# Patient Record
Sex: Male | Born: 1935 | Race: White | Hispanic: No | Smoking: Former smoker
Health system: Southern US, Community
[De-identification: ages and names within clinical notes are randomized; demographics above are authoritative.]

## PROBLEM LIST (undated history)

## (undated) DIAGNOSIS — E785 Hyperlipidemia, unspecified: Secondary | ICD-10-CM

## (undated) DIAGNOSIS — I10 Essential (primary) hypertension: Secondary | ICD-10-CM

## (undated) DIAGNOSIS — G629 Polyneuropathy, unspecified: Secondary | ICD-10-CM

## (undated) DIAGNOSIS — J60 Coalworker's pneumoconiosis: Secondary | ICD-10-CM

## (undated) HISTORY — DX: Hyperlipidemia, unspecified: E78.5

## (undated) HISTORY — DX: Essential (primary) hypertension: I10

## (undated) HISTORY — DX: Coalworker's pneumoconiosis: J60

## (undated) HISTORY — DX: Polyneuropathy, unspecified: G62.9

---

## 2009-03-28 ENCOUNTER — Telehealth: Payer: Self-pay | Admitting: Internal Medicine

## 2009-03-29 ENCOUNTER — Ambulatory Visit: Payer: Self-pay | Admitting: Hematology and Oncology

## 2009-03-29 ENCOUNTER — Encounter: Payer: Self-pay | Admitting: Internal Medicine

## 2009-04-10 LAB — MORPHOLOGY

## 2009-04-10 LAB — CBC & DIFF AND RETIC
EOS%: 4 % (ref 0.0–7.0)
MCH: 28.3 pg (ref 27.2–33.4)
MCV: 88.4 fL (ref 79.3–98.0)
MONO%: 15.9 % — ABNORMAL HIGH (ref 0.0–14.0)
NEUT#: 2.8 10*3/uL (ref 1.5–6.5)
RBC: 4.67 10*6/uL (ref 4.20–5.82)
RDW: 16.1 % — ABNORMAL HIGH (ref 11.0–14.6)
Retic %: 1.42 % (ref 0.50–1.60)
Retic Ct Abs: 66.31 10*3/uL (ref 24.10–77.50)

## 2009-04-12 LAB — FERRITIN: Ferritin: 26 ng/mL (ref 22–322)

## 2009-04-12 LAB — PROTEIN ELECTROPHORESIS, SERUM, WITH REFLEX
Alpha-2-Globulin: 10 % (ref 7.1–11.8)
Beta 2: 3 % — ABNORMAL LOW (ref 3.2–6.5)
Beta Globulin: 6.1 % (ref 4.7–7.2)
Total Protein, Serum Electrophoresis: 8.2 g/dL (ref 6.0–8.3)

## 2009-04-12 LAB — COMPREHENSIVE METABOLIC PANEL
ALT: 17 U/L (ref 0–53)
Albumin: 4.8 g/dL (ref 3.5–5.2)
BUN: 22 mg/dL (ref 6–23)
Creatinine, Ser: 1.36 mg/dL (ref 0.40–1.50)
Total Bilirubin: 0.7 mg/dL (ref 0.3–1.2)
Total Protein: 8.2 g/dL (ref 6.0–8.3)

## 2009-04-12 LAB — LACTATE DEHYDROGENASE: LDH: 211 U/L (ref 94–250)

## 2009-10-15 ENCOUNTER — Ambulatory Visit: Payer: Self-pay | Admitting: Hematology and Oncology

## 2010-03-11 NOTE — Letter (Signed)
Summary: New Patient letter  [facility]  [address] [place], [place] [address]   Phone: [phone]  Fax: [phone]       03/29/2009 MRN: [id number]  Ernest Sanchez [address] [place], [place]  [address]  Dear Mr. Martos,  Welcome to the Gastroenterology Division at [facility].    You are scheduled to see Dr. Darylene Price on Monday- March 7,2011 at 10:15 a.m. on the 3rd floor at [facility], [address].  We ask that you try to arrive at our office 15 minutes prior to your appointment time to allow for check-in.  We would like you to complete the enclosed self-administered evaluation form prior to your visit and bring it with you on the day of your appointment.  We will review it with you.  Also, please bring a complete list of all your medications or, if you prefer, bring the medication bottles and we will list them.  Please bring your insurance card so that we may make a copy of it.  If your insurance requires a referral to see a specialist, please bring your referral form from your primary care physician.  Co-payments are due at the time of your visit and may be paid by cash, check or credit card.     Your office visit will consist of a consult with your physician (includes a physical exam), any laboratory testing he/she may order, scheduling of any necessary diagnostic testing (e.g. x-ray, ultrasound, CT-scan), and scheduling of a procedure (e.g. Endoscopy, Colonoscopy) if required.  Please allow enough time on your schedule to allow for any/all of these possibilities.    If you cannot keep your appointment, please call [phone] to cancel or reschedule prior to your appointment date.  This allows [place] the opportunity to schedule an appointment for another patient in need of care.  If you do not cancel or reschedule by 5 p.m. the business day prior to your appointment date, you will be charged a $50.00 late cancellation/no-show fee.    Thank you for  choosing [facility] for your medical needs.  We appreciate the opportunity to care for you.  Please visit [place] at our website  to learn more about our practice.                     Sincerely,                                                             The Gastroenterology Division

## 2010-03-11 NOTE — Progress Notes (Signed)
Summary: Appt sooner than next available  Phone Note From Other Clinic   Caller: [facility] @ Dr Silvano Rusk office [phone] Call For: Dr Marina Goodell Reason for Call: Schedule Patient Appt Summary of Call: Wonders if you can work in patient sooner than March 30th. Will fax you the few notes she has-pt is new to them. Initial call taken by: Leanor Kail [facility],  March 28, 2009 4:29 PM  Follow-up for Phone Call        Pt. ntfd. that appt.has been moved up to 04/15/2009. Follow-up by: Teryl Lucy RN,  March 29, 2009 12:14 PM

## 2011-02-11 DIAGNOSIS — Z7901 Long term (current) use of anticoagulants: Secondary | ICD-10-CM | POA: Diagnosis not present

## 2011-02-11 DIAGNOSIS — I4891 Unspecified atrial fibrillation: Secondary | ICD-10-CM | POA: Diagnosis not present

## 2011-03-12 DIAGNOSIS — Z7901 Long term (current) use of anticoagulants: Secondary | ICD-10-CM | POA: Diagnosis not present

## 2011-03-12 DIAGNOSIS — I4891 Unspecified atrial fibrillation: Secondary | ICD-10-CM | POA: Diagnosis not present

## 2011-04-08 DIAGNOSIS — I4891 Unspecified atrial fibrillation: Secondary | ICD-10-CM | POA: Diagnosis not present

## 2011-04-08 DIAGNOSIS — Z7901 Long term (current) use of anticoagulants: Secondary | ICD-10-CM | POA: Diagnosis not present

## 2011-05-06 DIAGNOSIS — C4441 Basal cell carcinoma of skin of scalp and neck: Secondary | ICD-10-CM | POA: Diagnosis not present

## 2011-05-06 DIAGNOSIS — I4891 Unspecified atrial fibrillation: Secondary | ICD-10-CM | POA: Diagnosis not present

## 2011-05-06 DIAGNOSIS — Z85828 Personal history of other malignant neoplasm of skin: Secondary | ICD-10-CM | POA: Diagnosis not present

## 2011-05-06 DIAGNOSIS — C44319 Basal cell carcinoma of skin of other parts of face: Secondary | ICD-10-CM | POA: Diagnosis not present

## 2011-05-06 DIAGNOSIS — L570 Actinic keratosis: Secondary | ICD-10-CM | POA: Diagnosis not present

## 2011-05-06 DIAGNOSIS — Z7901 Long term (current) use of anticoagulants: Secondary | ICD-10-CM | POA: Diagnosis not present

## 2011-05-06 DIAGNOSIS — D239 Other benign neoplasm of skin, unspecified: Secondary | ICD-10-CM | POA: Diagnosis not present

## 2011-06-01 DIAGNOSIS — I4891 Unspecified atrial fibrillation: Secondary | ICD-10-CM | POA: Diagnosis not present

## 2011-06-01 DIAGNOSIS — K219 Gastro-esophageal reflux disease without esophagitis: Secondary | ICD-10-CM | POA: Diagnosis not present

## 2011-06-01 DIAGNOSIS — R509 Fever, unspecified: Secondary | ICD-10-CM | POA: Diagnosis not present

## 2011-06-01 DIAGNOSIS — J209 Acute bronchitis, unspecified: Secondary | ICD-10-CM | POA: Diagnosis not present

## 2011-06-09 DIAGNOSIS — I4891 Unspecified atrial fibrillation: Secondary | ICD-10-CM | POA: Diagnosis not present

## 2011-06-09 DIAGNOSIS — R05 Cough: Secondary | ICD-10-CM | POA: Diagnosis not present

## 2011-06-09 DIAGNOSIS — Z7901 Long term (current) use of anticoagulants: Secondary | ICD-10-CM | POA: Diagnosis not present

## 2011-06-29 DIAGNOSIS — C44211 Basal cell carcinoma of skin of unspecified ear and external auricular canal: Secondary | ICD-10-CM | POA: Diagnosis not present

## 2011-07-09 DIAGNOSIS — Z7901 Long term (current) use of anticoagulants: Secondary | ICD-10-CM | POA: Diagnosis not present

## 2011-07-09 DIAGNOSIS — I4891 Unspecified atrial fibrillation: Secondary | ICD-10-CM | POA: Diagnosis not present

## 2011-08-11 DIAGNOSIS — Z7901 Long term (current) use of anticoagulants: Secondary | ICD-10-CM | POA: Diagnosis not present

## 2011-08-11 DIAGNOSIS — I4891 Unspecified atrial fibrillation: Secondary | ICD-10-CM | POA: Diagnosis not present

## 2011-08-21 DIAGNOSIS — E785 Hyperlipidemia, unspecified: Secondary | ICD-10-CM | POA: Diagnosis not present

## 2011-08-21 DIAGNOSIS — Z125 Encounter for screening for malignant neoplasm of prostate: Secondary | ICD-10-CM | POA: Diagnosis not present

## 2011-08-21 DIAGNOSIS — Z79899 Other long term (current) drug therapy: Secondary | ICD-10-CM | POA: Diagnosis not present

## 2011-08-28 DIAGNOSIS — R0609 Other forms of dyspnea: Secondary | ICD-10-CM | POA: Diagnosis not present

## 2011-08-28 DIAGNOSIS — Z0000 Encounter for general adult medical examination without abnormal findings: Secondary | ICD-10-CM | POA: Diagnosis not present

## 2011-08-28 DIAGNOSIS — M79609 Pain in unspecified limb: Secondary | ICD-10-CM | POA: Diagnosis not present

## 2011-08-28 DIAGNOSIS — J449 Chronic obstructive pulmonary disease, unspecified: Secondary | ICD-10-CM | POA: Diagnosis not present

## 2011-08-28 DIAGNOSIS — R0989 Other specified symptoms and signs involving the circulatory and respiratory systems: Secondary | ICD-10-CM | POA: Diagnosis not present

## 2011-09-01 DIAGNOSIS — Z1212 Encounter for screening for malignant neoplasm of rectum: Secondary | ICD-10-CM | POA: Diagnosis not present

## 2011-09-15 DIAGNOSIS — I4891 Unspecified atrial fibrillation: Secondary | ICD-10-CM | POA: Diagnosis not present

## 2011-09-15 DIAGNOSIS — Z7901 Long term (current) use of anticoagulants: Secondary | ICD-10-CM | POA: Diagnosis not present

## 2011-09-29 DIAGNOSIS — I4891 Unspecified atrial fibrillation: Secondary | ICD-10-CM | POA: Diagnosis not present

## 2011-09-29 DIAGNOSIS — Z7901 Long term (current) use of anticoagulants: Secondary | ICD-10-CM | POA: Diagnosis not present

## 2011-10-15 DIAGNOSIS — I4891 Unspecified atrial fibrillation: Secondary | ICD-10-CM | POA: Diagnosis not present

## 2011-10-15 DIAGNOSIS — Z7901 Long term (current) use of anticoagulants: Secondary | ICD-10-CM | POA: Diagnosis not present

## 2011-11-18 DIAGNOSIS — Z7901 Long term (current) use of anticoagulants: Secondary | ICD-10-CM | POA: Diagnosis not present

## 2011-11-18 DIAGNOSIS — I4891 Unspecified atrial fibrillation: Secondary | ICD-10-CM | POA: Diagnosis not present

## 2011-12-24 DIAGNOSIS — I4891 Unspecified atrial fibrillation: Secondary | ICD-10-CM | POA: Diagnosis not present

## 2011-12-24 DIAGNOSIS — Z7901 Long term (current) use of anticoagulants: Secondary | ICD-10-CM | POA: Diagnosis not present

## 2012-01-05 DIAGNOSIS — Z7901 Long term (current) use of anticoagulants: Secondary | ICD-10-CM | POA: Diagnosis not present

## 2012-01-05 DIAGNOSIS — I4891 Unspecified atrial fibrillation: Secondary | ICD-10-CM | POA: Diagnosis not present

## 2012-02-11 DIAGNOSIS — I4891 Unspecified atrial fibrillation: Secondary | ICD-10-CM | POA: Diagnosis not present

## 2012-02-11 DIAGNOSIS — Z7901 Long term (current) use of anticoagulants: Secondary | ICD-10-CM | POA: Diagnosis not present

## 2012-02-24 DIAGNOSIS — Z7901 Long term (current) use of anticoagulants: Secondary | ICD-10-CM | POA: Diagnosis not present

## 2012-02-24 DIAGNOSIS — I4891 Unspecified atrial fibrillation: Secondary | ICD-10-CM | POA: Diagnosis not present

## 2012-04-21 DIAGNOSIS — Z7901 Long term (current) use of anticoagulants: Secondary | ICD-10-CM | POA: Diagnosis not present

## 2012-04-21 DIAGNOSIS — I4891 Unspecified atrial fibrillation: Secondary | ICD-10-CM | POA: Diagnosis not present

## 2012-05-06 DIAGNOSIS — Z85828 Personal history of other malignant neoplasm of skin: Secondary | ICD-10-CM | POA: Diagnosis not present

## 2012-05-06 DIAGNOSIS — L299 Pruritus, unspecified: Secondary | ICD-10-CM | POA: Diagnosis not present

## 2012-05-06 DIAGNOSIS — L821 Other seborrheic keratosis: Secondary | ICD-10-CM | POA: Diagnosis not present

## 2012-05-06 DIAGNOSIS — L570 Actinic keratosis: Secondary | ICD-10-CM | POA: Diagnosis not present

## 2012-05-25 DIAGNOSIS — I4891 Unspecified atrial fibrillation: Secondary | ICD-10-CM | POA: Diagnosis not present

## 2012-05-25 DIAGNOSIS — Z7901 Long term (current) use of anticoagulants: Secondary | ICD-10-CM | POA: Diagnosis not present

## 2012-06-28 DIAGNOSIS — Z7901 Long term (current) use of anticoagulants: Secondary | ICD-10-CM | POA: Diagnosis not present

## 2012-06-28 DIAGNOSIS — I4891 Unspecified atrial fibrillation: Secondary | ICD-10-CM | POA: Diagnosis not present

## 2012-07-25 DIAGNOSIS — I4891 Unspecified atrial fibrillation: Secondary | ICD-10-CM | POA: Diagnosis not present

## 2012-07-25 DIAGNOSIS — M779 Enthesopathy, unspecified: Secondary | ICD-10-CM | POA: Diagnosis not present

## 2012-07-25 DIAGNOSIS — L600 Ingrowing nail: Secondary | ICD-10-CM | POA: Diagnosis not present

## 2012-07-25 DIAGNOSIS — Z7901 Long term (current) use of anticoagulants: Secondary | ICD-10-CM | POA: Diagnosis not present

## 2012-07-25 DIAGNOSIS — M109 Gout, unspecified: Secondary | ICD-10-CM | POA: Diagnosis not present

## 2012-07-26 DIAGNOSIS — Z6827 Body mass index (BMI) 27.0-27.9, adult: Secondary | ICD-10-CM | POA: Diagnosis not present

## 2012-07-26 DIAGNOSIS — Z79899 Other long term (current) drug therapy: Secondary | ICD-10-CM | POA: Diagnosis not present

## 2012-07-26 DIAGNOSIS — R0609 Other forms of dyspnea: Secondary | ICD-10-CM | POA: Diagnosis not present

## 2012-07-26 DIAGNOSIS — I2510 Atherosclerotic heart disease of native coronary artery without angina pectoris: Secondary | ICD-10-CM | POA: Diagnosis not present

## 2012-07-26 DIAGNOSIS — I4891 Unspecified atrial fibrillation: Secondary | ICD-10-CM | POA: Diagnosis not present

## 2012-07-26 DIAGNOSIS — R05 Cough: Secondary | ICD-10-CM | POA: Diagnosis not present

## 2012-07-26 DIAGNOSIS — R609 Edema, unspecified: Secondary | ICD-10-CM | POA: Diagnosis not present

## 2012-08-31 DIAGNOSIS — Z125 Encounter for screening for malignant neoplasm of prostate: Secondary | ICD-10-CM | POA: Diagnosis not present

## 2012-08-31 DIAGNOSIS — Z79899 Other long term (current) drug therapy: Secondary | ICD-10-CM | POA: Diagnosis not present

## 2012-08-31 DIAGNOSIS — E785 Hyperlipidemia, unspecified: Secondary | ICD-10-CM | POA: Diagnosis not present

## 2012-08-31 DIAGNOSIS — Z7901 Long term (current) use of anticoagulants: Secondary | ICD-10-CM | POA: Diagnosis not present

## 2012-09-06 DIAGNOSIS — Z1212 Encounter for screening for malignant neoplasm of rectum: Secondary | ICD-10-CM | POA: Diagnosis not present

## 2012-09-07 DIAGNOSIS — I4891 Unspecified atrial fibrillation: Secondary | ICD-10-CM | POA: Diagnosis not present

## 2012-09-07 DIAGNOSIS — R0989 Other specified symptoms and signs involving the circulatory and respiratory systems: Secondary | ICD-10-CM | POA: Diagnosis not present

## 2012-09-07 DIAGNOSIS — H6120 Impacted cerumen, unspecified ear: Secondary | ICD-10-CM | POA: Diagnosis not present

## 2012-09-07 DIAGNOSIS — Z0000 Encounter for general adult medical examination without abnormal findings: Secondary | ICD-10-CM | POA: Diagnosis not present

## 2012-09-07 DIAGNOSIS — R0609 Other forms of dyspnea: Secondary | ICD-10-CM | POA: Diagnosis not present

## 2012-09-07 DIAGNOSIS — Z1331 Encounter for screening for depression: Secondary | ICD-10-CM | POA: Diagnosis not present

## 2012-09-07 DIAGNOSIS — D61818 Other pancytopenia: Secondary | ICD-10-CM | POA: Diagnosis not present

## 2012-09-07 DIAGNOSIS — Z7901 Long term (current) use of anticoagulants: Secondary | ICD-10-CM | POA: Diagnosis not present

## 2012-09-07 DIAGNOSIS — M545 Low back pain: Secondary | ICD-10-CM | POA: Diagnosis not present

## 2012-10-05 DIAGNOSIS — I4891 Unspecified atrial fibrillation: Secondary | ICD-10-CM | POA: Diagnosis not present

## 2012-10-05 DIAGNOSIS — Z23 Encounter for immunization: Secondary | ICD-10-CM | POA: Diagnosis not present

## 2012-10-05 DIAGNOSIS — Z7901 Long term (current) use of anticoagulants: Secondary | ICD-10-CM | POA: Diagnosis not present

## 2012-11-04 DIAGNOSIS — Z7901 Long term (current) use of anticoagulants: Secondary | ICD-10-CM | POA: Diagnosis not present

## 2012-11-04 DIAGNOSIS — I4891 Unspecified atrial fibrillation: Secondary | ICD-10-CM | POA: Diagnosis not present

## 2012-12-02 DIAGNOSIS — Z7901 Long term (current) use of anticoagulants: Secondary | ICD-10-CM | POA: Diagnosis not present

## 2012-12-02 DIAGNOSIS — I4891 Unspecified atrial fibrillation: Secondary | ICD-10-CM | POA: Diagnosis not present

## 2013-02-07 DIAGNOSIS — I4891 Unspecified atrial fibrillation: Secondary | ICD-10-CM | POA: Diagnosis not present

## 2013-02-07 DIAGNOSIS — Z7901 Long term (current) use of anticoagulants: Secondary | ICD-10-CM | POA: Diagnosis not present

## 2013-03-10 DIAGNOSIS — Z7901 Long term (current) use of anticoagulants: Secondary | ICD-10-CM | POA: Diagnosis not present

## 2013-03-10 DIAGNOSIS — I4891 Unspecified atrial fibrillation: Secondary | ICD-10-CM | POA: Diagnosis not present

## 2013-03-14 DIAGNOSIS — R6883 Chills (without fever): Secondary | ICD-10-CM | POA: Diagnosis not present

## 2013-03-14 DIAGNOSIS — R05 Cough: Secondary | ICD-10-CM | POA: Diagnosis not present

## 2013-03-14 DIAGNOSIS — J111 Influenza due to unidentified influenza virus with other respiratory manifestations: Secondary | ICD-10-CM | POA: Diagnosis not present

## 2013-03-14 DIAGNOSIS — R059 Cough, unspecified: Secondary | ICD-10-CM | POA: Diagnosis not present

## 2013-04-10 DIAGNOSIS — I4891 Unspecified atrial fibrillation: Secondary | ICD-10-CM | POA: Diagnosis not present

## 2013-04-10 DIAGNOSIS — J449 Chronic obstructive pulmonary disease, unspecified: Secondary | ICD-10-CM | POA: Diagnosis not present

## 2013-04-10 DIAGNOSIS — Z7901 Long term (current) use of anticoagulants: Secondary | ICD-10-CM | POA: Diagnosis not present

## 2013-05-05 DIAGNOSIS — L570 Actinic keratosis: Secondary | ICD-10-CM | POA: Diagnosis not present

## 2013-05-05 DIAGNOSIS — L821 Other seborrheic keratosis: Secondary | ICD-10-CM | POA: Diagnosis not present

## 2013-05-05 DIAGNOSIS — D485 Neoplasm of uncertain behavior of skin: Secondary | ICD-10-CM | POA: Diagnosis not present

## 2013-05-05 DIAGNOSIS — Z85828 Personal history of other malignant neoplasm of skin: Secondary | ICD-10-CM | POA: Diagnosis not present

## 2013-05-11 DIAGNOSIS — Z7901 Long term (current) use of anticoagulants: Secondary | ICD-10-CM | POA: Diagnosis not present

## 2013-05-11 DIAGNOSIS — I4891 Unspecified atrial fibrillation: Secondary | ICD-10-CM | POA: Diagnosis not present

## 2013-05-11 DIAGNOSIS — G589 Mononeuropathy, unspecified: Secondary | ICD-10-CM | POA: Diagnosis not present

## 2013-06-09 DIAGNOSIS — Z7901 Long term (current) use of anticoagulants: Secondary | ICD-10-CM | POA: Diagnosis not present

## 2013-06-09 DIAGNOSIS — I4891 Unspecified atrial fibrillation: Secondary | ICD-10-CM | POA: Diagnosis not present

## 2013-07-07 DIAGNOSIS — I4891 Unspecified atrial fibrillation: Secondary | ICD-10-CM | POA: Diagnosis not present

## 2013-07-07 DIAGNOSIS — Z7901 Long term (current) use of anticoagulants: Secondary | ICD-10-CM | POA: Diagnosis not present

## 2013-08-03 DIAGNOSIS — I4891 Unspecified atrial fibrillation: Secondary | ICD-10-CM | POA: Diagnosis not present

## 2013-08-03 DIAGNOSIS — Z7901 Long term (current) use of anticoagulants: Secondary | ICD-10-CM | POA: Diagnosis not present

## 2013-09-07 DIAGNOSIS — Z125 Encounter for screening for malignant neoplasm of prostate: Secondary | ICD-10-CM | POA: Diagnosis not present

## 2013-09-07 DIAGNOSIS — Z7901 Long term (current) use of anticoagulants: Secondary | ICD-10-CM | POA: Diagnosis not present

## 2013-09-07 DIAGNOSIS — R82998 Other abnormal findings in urine: Secondary | ICD-10-CM | POA: Diagnosis not present

## 2013-09-07 DIAGNOSIS — N183 Chronic kidney disease, stage 3 unspecified: Secondary | ICD-10-CM | POA: Diagnosis not present

## 2013-09-07 DIAGNOSIS — E785 Hyperlipidemia, unspecified: Secondary | ICD-10-CM | POA: Diagnosis not present

## 2013-09-07 DIAGNOSIS — I4891 Unspecified atrial fibrillation: Secondary | ICD-10-CM | POA: Diagnosis not present

## 2013-09-21 DIAGNOSIS — I4891 Unspecified atrial fibrillation: Secondary | ICD-10-CM | POA: Diagnosis not present

## 2013-09-21 DIAGNOSIS — Z1331 Encounter for screening for depression: Secondary | ICD-10-CM | POA: Diagnosis not present

## 2013-09-21 DIAGNOSIS — N183 Chronic kidney disease, stage 3 unspecified: Secondary | ICD-10-CM | POA: Diagnosis not present

## 2013-09-21 DIAGNOSIS — R7301 Impaired fasting glucose: Secondary | ICD-10-CM | POA: Diagnosis not present

## 2013-09-21 DIAGNOSIS — Z7901 Long term (current) use of anticoagulants: Secondary | ICD-10-CM | POA: Diagnosis not present

## 2013-09-21 DIAGNOSIS — I2510 Atherosclerotic heart disease of native coronary artery without angina pectoris: Secondary | ICD-10-CM | POA: Diagnosis not present

## 2013-09-21 DIAGNOSIS — Z0000 Encounter for general adult medical examination without abnormal findings: Secondary | ICD-10-CM | POA: Diagnosis not present

## 2013-09-21 DIAGNOSIS — J449 Chronic obstructive pulmonary disease, unspecified: Secondary | ICD-10-CM | POA: Diagnosis not present

## 2013-09-21 DIAGNOSIS — Z1212 Encounter for screening for malignant neoplasm of rectum: Secondary | ICD-10-CM | POA: Diagnosis not present

## 2013-09-21 DIAGNOSIS — K219 Gastro-esophageal reflux disease without esophagitis: Secondary | ICD-10-CM | POA: Diagnosis not present

## 2013-10-20 DIAGNOSIS — Z7901 Long term (current) use of anticoagulants: Secondary | ICD-10-CM | POA: Diagnosis not present

## 2013-10-20 DIAGNOSIS — I4891 Unspecified atrial fibrillation: Secondary | ICD-10-CM | POA: Diagnosis not present

## 2013-11-02 ENCOUNTER — Ambulatory Visit: Payer: Self-pay | Admitting: Podiatry

## 2013-11-06 DIAGNOSIS — L821 Other seborrheic keratosis: Secondary | ICD-10-CM | POA: Diagnosis not present

## 2013-11-06 DIAGNOSIS — B079 Viral wart, unspecified: Secondary | ICD-10-CM | POA: Diagnosis not present

## 2013-11-06 DIAGNOSIS — L570 Actinic keratosis: Secondary | ICD-10-CM | POA: Diagnosis not present

## 2013-11-06 DIAGNOSIS — D485 Neoplasm of uncertain behavior of skin: Secondary | ICD-10-CM | POA: Diagnosis not present

## 2013-11-06 DIAGNOSIS — Z85828 Personal history of other malignant neoplasm of skin: Secondary | ICD-10-CM | POA: Diagnosis not present

## 2013-11-09 ENCOUNTER — Ambulatory Visit (INDEPENDENT_AMBULATORY_CARE_PROVIDER_SITE_OTHER): Payer: Medicare Other

## 2013-11-09 ENCOUNTER — Encounter: Payer: Self-pay | Admitting: Podiatry

## 2013-11-09 ENCOUNTER — Ambulatory Visit (INDEPENDENT_AMBULATORY_CARE_PROVIDER_SITE_OTHER): Payer: Medicare Other | Admitting: Podiatry

## 2013-11-09 VITALS — BP 146/84 | HR 74 | Resp 16 | Ht 72.0 in | Wt 185.0 lb

## 2013-11-09 DIAGNOSIS — L03031 Cellulitis of right toe: Secondary | ICD-10-CM

## 2013-11-09 DIAGNOSIS — M2011 Hallux valgus (acquired), right foot: Secondary | ICD-10-CM | POA: Diagnosis not present

## 2013-11-09 DIAGNOSIS — M779 Enthesopathy, unspecified: Secondary | ICD-10-CM

## 2013-11-09 DIAGNOSIS — M21611 Bunion of right foot: Secondary | ICD-10-CM

## 2013-11-09 MED ORDER — TRIAMCINOLONE ACETONIDE 10 MG/ML IJ SUSP
10.0000 mg | Freq: Once | INTRAMUSCULAR | Status: AC
  Administered 2013-11-09: 10 mg

## 2013-11-09 NOTE — Progress Notes (Signed)
Subjective:     Patient ID: Vence Lalor, male   DOB: [birth year], 80 y.o.   MRN: [id number]  HPI patient states that I have inflammation around my first metatarsal head left it's painful and ingrown toenail my right big toe medial border that have not been able to get out myself. States it's been hurting for around a year   Review of Systems     Objective:   Physical Exam Neurovascular status is intact with muscle strength adequate in range of motion within normal limits for his age. Large hyperostosis around the medial aspect first metatarsal head left with mild redness and fluid buildup upon palpation. 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 Discussed bunion correction which were to hold off on and try conservative care and I injected the capsule today 3 mg Kenalog 5 mg Xylocaine left foot. For the right I did infiltrate 60 mg Xylocaine Marcaine mixture remove the medial border proud flesh abscess tissue and allowed channel for drainage and applied sterile dressing. Instructed on soaks and reappoint

## 2013-11-09 NOTE — Progress Notes (Signed)
   Subjective:    Patient ID: Ernest Sanchez, male    DOB: Aug 11, 1926, 80 y.o.   MRN: [id number]  HPI Comments: Pt complains of burning pain in left 1st MPJ for over 1 year.  Pt states he dug the right 1st medial toenail out due to pain, but has more nail in the that is pain.  Pt request debridement of the remaining toenails.     Review of Systems  All other systems reviewed and are negative.      Objective:   Physical Exam        Assessment & Plan:

## 2013-11-17 DIAGNOSIS — I480 Paroxysmal atrial fibrillation: Secondary | ICD-10-CM | POA: Diagnosis not present

## 2013-11-17 DIAGNOSIS — Z7901 Long term (current) use of anticoagulants: Secondary | ICD-10-CM | POA: Diagnosis not present

## 2013-12-18 DIAGNOSIS — I480 Paroxysmal atrial fibrillation: Secondary | ICD-10-CM | POA: Diagnosis not present

## 2013-12-18 DIAGNOSIS — Z7901 Long term (current) use of anticoagulants: Secondary | ICD-10-CM | POA: Diagnosis not present

## 2014-01-16 DIAGNOSIS — I480 Paroxysmal atrial fibrillation: Secondary | ICD-10-CM | POA: Diagnosis not present

## 2014-01-16 DIAGNOSIS — Z7901 Long term (current) use of anticoagulants: Secondary | ICD-10-CM | POA: Diagnosis not present

## 2014-02-20 DIAGNOSIS — I480 Paroxysmal atrial fibrillation: Secondary | ICD-10-CM | POA: Diagnosis not present

## 2014-02-20 DIAGNOSIS — Z7901 Long term (current) use of anticoagulants: Secondary | ICD-10-CM | POA: Diagnosis not present

## 2014-03-20 DIAGNOSIS — I480 Paroxysmal atrial fibrillation: Secondary | ICD-10-CM | POA: Diagnosis not present

## 2014-03-20 DIAGNOSIS — Z7901 Long term (current) use of anticoagulants: Secondary | ICD-10-CM | POA: Diagnosis not present

## 2014-04-17 DIAGNOSIS — Z7901 Long term (current) use of anticoagulants: Secondary | ICD-10-CM | POA: Diagnosis not present

## 2014-04-17 DIAGNOSIS — I480 Paroxysmal atrial fibrillation: Secondary | ICD-10-CM | POA: Diagnosis not present

## 2014-04-22 DIAGNOSIS — R42 Dizziness and giddiness: Secondary | ICD-10-CM | POA: Diagnosis not present

## 2014-04-22 DIAGNOSIS — R110 Nausea: Secondary | ICD-10-CM | POA: Diagnosis not present

## 2014-04-25 DIAGNOSIS — J449 Chronic obstructive pulmonary disease, unspecified: Secondary | ICD-10-CM | POA: Diagnosis not present

## 2014-04-25 DIAGNOSIS — H8110 Benign paroxysmal vertigo, unspecified ear: Secondary | ICD-10-CM | POA: Diagnosis not present

## 2014-04-25 DIAGNOSIS — R5383 Other fatigue: Secondary | ICD-10-CM | POA: Diagnosis not present

## 2014-04-25 DIAGNOSIS — I480 Paroxysmal atrial fibrillation: Secondary | ICD-10-CM | POA: Diagnosis not present

## 2014-04-25 DIAGNOSIS — R0609 Other forms of dyspnea: Secondary | ICD-10-CM | POA: Diagnosis not present

## 2014-04-25 DIAGNOSIS — I2510 Atherosclerotic heart disease of native coronary artery without angina pectoris: Secondary | ICD-10-CM | POA: Diagnosis not present

## 2014-04-25 DIAGNOSIS — Z6827 Body mass index (BMI) 27.0-27.9, adult: Secondary | ICD-10-CM | POA: Diagnosis not present

## 2014-05-01 DIAGNOSIS — H9192 Unspecified hearing loss, left ear: Secondary | ICD-10-CM | POA: Diagnosis not present

## 2014-05-01 DIAGNOSIS — H6122 Impacted cerumen, left ear: Secondary | ICD-10-CM | POA: Diagnosis not present

## 2014-05-01 DIAGNOSIS — R42 Dizziness and giddiness: Secondary | ICD-10-CM | POA: Diagnosis not present

## 2014-05-14 ENCOUNTER — Ambulatory Visit: Payer: Medicare Other | Admitting: *Deleted

## 2014-05-15 DIAGNOSIS — L570 Actinic keratosis: Secondary | ICD-10-CM | POA: Diagnosis not present

## 2014-05-15 DIAGNOSIS — L820 Inflamed seborrheic keratosis: Secondary | ICD-10-CM | POA: Diagnosis not present

## 2014-05-15 DIAGNOSIS — L218 Other seborrheic dermatitis: Secondary | ICD-10-CM | POA: Diagnosis not present

## 2014-05-15 DIAGNOSIS — Z85828 Personal history of other malignant neoplasm of skin: Secondary | ICD-10-CM | POA: Diagnosis not present

## 2014-05-15 DIAGNOSIS — D692 Other nonthrombocytopenic purpura: Secondary | ICD-10-CM | POA: Diagnosis not present

## 2014-05-15 DIAGNOSIS — L821 Other seborrheic keratosis: Secondary | ICD-10-CM | POA: Diagnosis not present

## 2014-05-17 DIAGNOSIS — I480 Paroxysmal atrial fibrillation: Secondary | ICD-10-CM | POA: Diagnosis not present

## 2014-05-17 DIAGNOSIS — Z7901 Long term (current) use of anticoagulants: Secondary | ICD-10-CM | POA: Diagnosis not present

## 2014-05-17 DIAGNOSIS — I2510 Atherosclerotic heart disease of native coronary artery without angina pectoris: Secondary | ICD-10-CM | POA: Diagnosis not present

## 2014-05-17 DIAGNOSIS — H8110 Benign paroxysmal vertigo, unspecified ear: Secondary | ICD-10-CM | POA: Diagnosis not present

## 2014-05-17 DIAGNOSIS — Z6826 Body mass index (BMI) 26.0-26.9, adult: Secondary | ICD-10-CM | POA: Diagnosis not present

## 2014-05-17 DIAGNOSIS — N183 Chronic kidney disease, stage 3 (moderate): Secondary | ICD-10-CM | POA: Diagnosis not present

## 2014-06-19 DIAGNOSIS — I480 Paroxysmal atrial fibrillation: Secondary | ICD-10-CM | POA: Diagnosis not present

## 2014-06-19 DIAGNOSIS — Z7901 Long term (current) use of anticoagulants: Secondary | ICD-10-CM | POA: Diagnosis not present

## 2014-07-11 DIAGNOSIS — I700 Atherosclerosis of aorta: Secondary | ICD-10-CM | POA: Diagnosis not present

## 2014-07-11 DIAGNOSIS — M545 Low back pain: Secondary | ICD-10-CM | POA: Diagnosis not present

## 2014-07-11 DIAGNOSIS — M25552 Pain in left hip: Secondary | ICD-10-CM | POA: Diagnosis not present

## 2014-07-11 DIAGNOSIS — M47816 Spondylosis without myelopathy or radiculopathy, lumbar region: Secondary | ICD-10-CM | POA: Diagnosis not present

## 2014-07-17 DIAGNOSIS — I480 Paroxysmal atrial fibrillation: Secondary | ICD-10-CM | POA: Diagnosis not present

## 2014-07-17 DIAGNOSIS — Z7901 Long term (current) use of anticoagulants: Secondary | ICD-10-CM | POA: Diagnosis not present

## 2014-08-17 DIAGNOSIS — Z7901 Long term (current) use of anticoagulants: Secondary | ICD-10-CM | POA: Diagnosis not present

## 2014-08-17 DIAGNOSIS — I480 Paroxysmal atrial fibrillation: Secondary | ICD-10-CM | POA: Diagnosis not present

## 2014-09-21 DIAGNOSIS — R8299 Other abnormal findings in urine: Secondary | ICD-10-CM | POA: Diagnosis not present

## 2014-09-21 DIAGNOSIS — E785 Hyperlipidemia, unspecified: Secondary | ICD-10-CM | POA: Diagnosis not present

## 2014-09-21 DIAGNOSIS — Z125 Encounter for screening for malignant neoplasm of prostate: Secondary | ICD-10-CM | POA: Diagnosis not present

## 2014-09-21 DIAGNOSIS — I480 Paroxysmal atrial fibrillation: Secondary | ICD-10-CM | POA: Diagnosis not present

## 2014-09-21 DIAGNOSIS — R7301 Impaired fasting glucose: Secondary | ICD-10-CM | POA: Diagnosis not present

## 2014-09-21 DIAGNOSIS — Z7901 Long term (current) use of anticoagulants: Secondary | ICD-10-CM | POA: Diagnosis not present

## 2014-09-21 DIAGNOSIS — N390 Urinary tract infection, site not specified: Secondary | ICD-10-CM | POA: Diagnosis not present

## 2014-09-21 DIAGNOSIS — N183 Chronic kidney disease, stage 3 (moderate): Secondary | ICD-10-CM | POA: Diagnosis not present

## 2014-09-27 DIAGNOSIS — I2510 Atherosclerotic heart disease of native coronary artery without angina pectoris: Secondary | ICD-10-CM | POA: Diagnosis not present

## 2014-09-27 DIAGNOSIS — M545 Low back pain: Secondary | ICD-10-CM | POA: Diagnosis not present

## 2014-09-27 DIAGNOSIS — Z0000 Encounter for general adult medical examination without abnormal findings: Secondary | ICD-10-CM | POA: Diagnosis not present

## 2014-09-27 DIAGNOSIS — E785 Hyperlipidemia, unspecified: Secondary | ICD-10-CM | POA: Diagnosis not present

## 2014-09-27 DIAGNOSIS — N183 Chronic kidney disease, stage 3 (moderate): Secondary | ICD-10-CM | POA: Diagnosis not present

## 2014-09-27 DIAGNOSIS — Z23 Encounter for immunization: Secondary | ICD-10-CM | POA: Diagnosis not present

## 2014-09-27 DIAGNOSIS — Z7901 Long term (current) use of anticoagulants: Secondary | ICD-10-CM | POA: Diagnosis not present

## 2014-09-27 DIAGNOSIS — I480 Paroxysmal atrial fibrillation: Secondary | ICD-10-CM | POA: Diagnosis not present

## 2014-09-27 DIAGNOSIS — J449 Chronic obstructive pulmonary disease, unspecified: Secondary | ICD-10-CM | POA: Diagnosis not present

## 2014-09-27 DIAGNOSIS — G629 Polyneuropathy, unspecified: Secondary | ICD-10-CM | POA: Diagnosis not present

## 2014-09-27 DIAGNOSIS — R7301 Impaired fasting glucose: Secondary | ICD-10-CM | POA: Diagnosis not present

## 2014-09-27 DIAGNOSIS — Z6825 Body mass index (BMI) 25.0-25.9, adult: Secondary | ICD-10-CM | POA: Diagnosis not present

## 2014-09-27 DIAGNOSIS — Z1389 Encounter for screening for other disorder: Secondary | ICD-10-CM | POA: Diagnosis not present

## 2014-10-19 DIAGNOSIS — Z7901 Long term (current) use of anticoagulants: Secondary | ICD-10-CM | POA: Diagnosis not present

## 2014-10-19 DIAGNOSIS — Z23 Encounter for immunization: Secondary | ICD-10-CM | POA: Diagnosis not present

## 2014-10-19 DIAGNOSIS — I480 Paroxysmal atrial fibrillation: Secondary | ICD-10-CM | POA: Diagnosis not present

## 2014-11-19 DIAGNOSIS — I480 Paroxysmal atrial fibrillation: Secondary | ICD-10-CM | POA: Diagnosis not present

## 2014-11-19 DIAGNOSIS — Z7901 Long term (current) use of anticoagulants: Secondary | ICD-10-CM | POA: Diagnosis not present

## 2014-12-21 DIAGNOSIS — Z7901 Long term (current) use of anticoagulants: Secondary | ICD-10-CM | POA: Diagnosis not present

## 2014-12-21 DIAGNOSIS — I480 Paroxysmal atrial fibrillation: Secondary | ICD-10-CM | POA: Diagnosis not present

## 2015-01-10 DIAGNOSIS — Z7901 Long term (current) use of anticoagulants: Secondary | ICD-10-CM | POA: Diagnosis not present

## 2015-01-10 DIAGNOSIS — I480 Paroxysmal atrial fibrillation: Secondary | ICD-10-CM | POA: Diagnosis not present

## 2015-02-07 DIAGNOSIS — Z7901 Long term (current) use of anticoagulants: Secondary | ICD-10-CM | POA: Diagnosis not present

## 2015-02-07 DIAGNOSIS — I480 Paroxysmal atrial fibrillation: Secondary | ICD-10-CM | POA: Diagnosis not present

## 2015-03-07 DIAGNOSIS — I480 Paroxysmal atrial fibrillation: Secondary | ICD-10-CM | POA: Diagnosis not present

## 2015-03-07 DIAGNOSIS — Z7901 Long term (current) use of anticoagulants: Secondary | ICD-10-CM | POA: Diagnosis not present

## 2015-04-09 DIAGNOSIS — J069 Acute upper respiratory infection, unspecified: Secondary | ICD-10-CM | POA: Diagnosis not present

## 2015-04-09 DIAGNOSIS — J449 Chronic obstructive pulmonary disease, unspecified: Secondary | ICD-10-CM | POA: Diagnosis not present

## 2015-04-09 DIAGNOSIS — I480 Paroxysmal atrial fibrillation: Secondary | ICD-10-CM | POA: Diagnosis not present

## 2015-04-09 DIAGNOSIS — Z7901 Long term (current) use of anticoagulants: Secondary | ICD-10-CM | POA: Diagnosis not present

## 2015-04-19 DIAGNOSIS — I480 Paroxysmal atrial fibrillation: Secondary | ICD-10-CM | POA: Diagnosis not present

## 2015-04-19 DIAGNOSIS — Z7901 Long term (current) use of anticoagulants: Secondary | ICD-10-CM | POA: Diagnosis not present

## 2015-05-08 DIAGNOSIS — M5442 Lumbago with sciatica, left side: Secondary | ICD-10-CM | POA: Diagnosis not present

## 2015-05-08 DIAGNOSIS — M47816 Spondylosis without myelopathy or radiculopathy, lumbar region: Secondary | ICD-10-CM | POA: Diagnosis not present

## 2015-05-08 DIAGNOSIS — M419 Scoliosis, unspecified: Secondary | ICD-10-CM | POA: Diagnosis not present

## 2015-05-16 DIAGNOSIS — Z7901 Long term (current) use of anticoagulants: Secondary | ICD-10-CM | POA: Diagnosis not present

## 2015-05-16 DIAGNOSIS — I480 Paroxysmal atrial fibrillation: Secondary | ICD-10-CM | POA: Diagnosis not present

## 2015-06-13 DIAGNOSIS — Z6825 Body mass index (BMI) 25.0-25.9, adult: Secondary | ICD-10-CM | POA: Diagnosis not present

## 2015-06-13 DIAGNOSIS — M545 Low back pain: Secondary | ICD-10-CM | POA: Diagnosis not present

## 2015-06-13 DIAGNOSIS — Z7901 Long term (current) use of anticoagulants: Secondary | ICD-10-CM | POA: Diagnosis not present

## 2015-06-13 DIAGNOSIS — I480 Paroxysmal atrial fibrillation: Secondary | ICD-10-CM | POA: Diagnosis not present

## 2015-07-09 DIAGNOSIS — D692 Other nonthrombocytopenic purpura: Secondary | ICD-10-CM | POA: Diagnosis not present

## 2015-07-09 DIAGNOSIS — L821 Other seborrheic keratosis: Secondary | ICD-10-CM | POA: Diagnosis not present

## 2015-07-09 DIAGNOSIS — Z85828 Personal history of other malignant neoplasm of skin: Secondary | ICD-10-CM | POA: Diagnosis not present

## 2015-07-09 DIAGNOSIS — D1801 Hemangioma of skin and subcutaneous tissue: Secondary | ICD-10-CM | POA: Diagnosis not present

## 2015-07-09 DIAGNOSIS — L570 Actinic keratosis: Secondary | ICD-10-CM | POA: Diagnosis not present

## 2015-07-15 DIAGNOSIS — J069 Acute upper respiratory infection, unspecified: Secondary | ICD-10-CM | POA: Diagnosis not present

## 2015-07-15 DIAGNOSIS — Z7901 Long term (current) use of anticoagulants: Secondary | ICD-10-CM | POA: Diagnosis not present

## 2015-07-15 DIAGNOSIS — I480 Paroxysmal atrial fibrillation: Secondary | ICD-10-CM | POA: Diagnosis not present

## 2015-07-15 DIAGNOSIS — M545 Low back pain: Secondary | ICD-10-CM | POA: Diagnosis not present

## 2015-08-08 DIAGNOSIS — S61212A Laceration without foreign body of right middle finger without damage to nail, initial encounter: Secondary | ICD-10-CM | POA: Diagnosis not present

## 2015-08-08 DIAGNOSIS — Z23 Encounter for immunization: Secondary | ICD-10-CM | POA: Diagnosis not present

## 2015-08-08 DIAGNOSIS — I480 Paroxysmal atrial fibrillation: Secondary | ICD-10-CM | POA: Diagnosis not present

## 2015-08-08 DIAGNOSIS — Z7901 Long term (current) use of anticoagulants: Secondary | ICD-10-CM | POA: Diagnosis not present

## 2015-09-05 DIAGNOSIS — I480 Paroxysmal atrial fibrillation: Secondary | ICD-10-CM | POA: Diagnosis not present

## 2015-09-05 DIAGNOSIS — Z7901 Long term (current) use of anticoagulants: Secondary | ICD-10-CM | POA: Diagnosis not present

## 2015-09-25 DIAGNOSIS — R8299 Other abnormal findings in urine: Secondary | ICD-10-CM | POA: Diagnosis not present

## 2015-09-25 DIAGNOSIS — Z125 Encounter for screening for malignant neoplasm of prostate: Secondary | ICD-10-CM | POA: Diagnosis not present

## 2015-09-25 DIAGNOSIS — E784 Other hyperlipidemia: Secondary | ICD-10-CM | POA: Diagnosis not present

## 2015-10-01 DIAGNOSIS — R0609 Other forms of dyspnea: Secondary | ICD-10-CM | POA: Diagnosis not present

## 2015-10-01 DIAGNOSIS — M25561 Pain in right knee: Secondary | ICD-10-CM | POA: Diagnosis not present

## 2015-10-01 DIAGNOSIS — E784 Other hyperlipidemia: Secondary | ICD-10-CM | POA: Diagnosis not present

## 2015-10-01 DIAGNOSIS — Z1389 Encounter for screening for other disorder: Secondary | ICD-10-CM | POA: Diagnosis not present

## 2015-10-01 DIAGNOSIS — M545 Low back pain: Secondary | ICD-10-CM | POA: Diagnosis not present

## 2015-10-01 DIAGNOSIS — M25562 Pain in left knee: Secondary | ICD-10-CM | POA: Diagnosis not present

## 2015-10-01 DIAGNOSIS — N183 Chronic kidney disease, stage 3 (moderate): Secondary | ICD-10-CM | POA: Diagnosis not present

## 2015-10-01 DIAGNOSIS — Z7901 Long term (current) use of anticoagulants: Secondary | ICD-10-CM | POA: Diagnosis not present

## 2015-10-01 DIAGNOSIS — Z0000 Encounter for general adult medical examination without abnormal findings: Secondary | ICD-10-CM | POA: Diagnosis not present

## 2015-10-01 DIAGNOSIS — G6289 Other specified polyneuropathies: Secondary | ICD-10-CM | POA: Diagnosis not present

## 2015-10-01 DIAGNOSIS — Z6826 Body mass index (BMI) 26.0-26.9, adult: Secondary | ICD-10-CM | POA: Diagnosis not present

## 2015-10-01 DIAGNOSIS — R7301 Impaired fasting glucose: Secondary | ICD-10-CM | POA: Diagnosis not present

## 2015-10-31 DIAGNOSIS — I480 Paroxysmal atrial fibrillation: Secondary | ICD-10-CM | POA: Diagnosis not present

## 2015-10-31 DIAGNOSIS — Z23 Encounter for immunization: Secondary | ICD-10-CM | POA: Diagnosis not present

## 2015-10-31 DIAGNOSIS — Z7901 Long term (current) use of anticoagulants: Secondary | ICD-10-CM | POA: Diagnosis not present

## 2015-11-20 DIAGNOSIS — J811 Chronic pulmonary edema: Secondary | ICD-10-CM | POA: Diagnosis not present

## 2015-11-20 DIAGNOSIS — R0602 Shortness of breath: Secondary | ICD-10-CM | POA: Diagnosis not present

## 2015-11-20 DIAGNOSIS — R05 Cough: Secondary | ICD-10-CM | POA: Diagnosis not present

## 2015-11-25 DIAGNOSIS — R05 Cough: Secondary | ICD-10-CM | POA: Diagnosis not present

## 2015-11-25 DIAGNOSIS — R0602 Shortness of breath: Secondary | ICD-10-CM | POA: Diagnosis not present

## 2015-11-28 DIAGNOSIS — I480 Paroxysmal atrial fibrillation: Secondary | ICD-10-CM | POA: Diagnosis not present

## 2015-11-28 DIAGNOSIS — M545 Low back pain: Secondary | ICD-10-CM | POA: Diagnosis not present

## 2015-11-28 DIAGNOSIS — Z7901 Long term (current) use of anticoagulants: Secondary | ICD-10-CM | POA: Diagnosis not present

## 2015-12-19 DIAGNOSIS — I480 Paroxysmal atrial fibrillation: Secondary | ICD-10-CM | POA: Diagnosis not present

## 2015-12-19 DIAGNOSIS — Z7901 Long term (current) use of anticoagulants: Secondary | ICD-10-CM | POA: Diagnosis not present

## 2016-01-08 DIAGNOSIS — Z7901 Long term (current) use of anticoagulants: Secondary | ICD-10-CM | POA: Diagnosis not present

## 2016-01-08 DIAGNOSIS — M25562 Pain in left knee: Secondary | ICD-10-CM | POA: Diagnosis not present

## 2016-01-08 DIAGNOSIS — I480 Paroxysmal atrial fibrillation: Secondary | ICD-10-CM | POA: Diagnosis not present

## 2016-01-08 DIAGNOSIS — R7301 Impaired fasting glucose: Secondary | ICD-10-CM | POA: Diagnosis not present

## 2016-01-08 DIAGNOSIS — Z6827 Body mass index (BMI) 27.0-27.9, adult: Secondary | ICD-10-CM | POA: Diagnosis not present

## 2016-02-18 DIAGNOSIS — I480 Paroxysmal atrial fibrillation: Secondary | ICD-10-CM | POA: Diagnosis not present

## 2016-02-18 DIAGNOSIS — J069 Acute upper respiratory infection, unspecified: Secondary | ICD-10-CM | POA: Diagnosis not present

## 2016-02-18 DIAGNOSIS — Z7901 Long term (current) use of anticoagulants: Secondary | ICD-10-CM | POA: Diagnosis not present

## 2016-03-09 DIAGNOSIS — I480 Paroxysmal atrial fibrillation: Secondary | ICD-10-CM | POA: Diagnosis not present

## 2016-03-09 DIAGNOSIS — Z7901 Long term (current) use of anticoagulants: Secondary | ICD-10-CM | POA: Diagnosis not present

## 2016-04-07 DIAGNOSIS — I480 Paroxysmal atrial fibrillation: Secondary | ICD-10-CM | POA: Diagnosis not present

## 2016-04-07 DIAGNOSIS — Z7901 Long term (current) use of anticoagulants: Secondary | ICD-10-CM | POA: Diagnosis not present

## 2016-04-21 DIAGNOSIS — G8929 Other chronic pain: Secondary | ICD-10-CM | POA: Diagnosis not present

## 2016-04-21 DIAGNOSIS — Z7901 Long term (current) use of anticoagulants: Secondary | ICD-10-CM | POA: Diagnosis not present

## 2016-04-21 DIAGNOSIS — N183 Chronic kidney disease, stage 3 (moderate): Secondary | ICD-10-CM | POA: Diagnosis not present

## 2016-04-21 DIAGNOSIS — Z6826 Body mass index (BMI) 26.0-26.9, adult: Secondary | ICD-10-CM | POA: Diagnosis not present

## 2016-04-21 DIAGNOSIS — I480 Paroxysmal atrial fibrillation: Secondary | ICD-10-CM | POA: Diagnosis not present

## 2016-04-27 DIAGNOSIS — M25561 Pain in right knee: Secondary | ICD-10-CM | POA: Diagnosis not present

## 2016-05-12 DIAGNOSIS — Z7901 Long term (current) use of anticoagulants: Secondary | ICD-10-CM | POA: Diagnosis not present

## 2016-05-12 DIAGNOSIS — I480 Paroxysmal atrial fibrillation: Secondary | ICD-10-CM | POA: Diagnosis not present

## 2016-05-28 DIAGNOSIS — J449 Chronic obstructive pulmonary disease, unspecified: Secondary | ICD-10-CM | POA: Diagnosis not present

## 2016-05-28 DIAGNOSIS — Z6826 Body mass index (BMI) 26.0-26.9, adult: Secondary | ICD-10-CM | POA: Diagnosis not present

## 2016-05-28 DIAGNOSIS — I2510 Atherosclerotic heart disease of native coronary artery without angina pectoris: Secondary | ICD-10-CM | POA: Diagnosis not present

## 2016-05-28 DIAGNOSIS — M545 Low back pain: Secondary | ICD-10-CM | POA: Diagnosis not present

## 2016-05-28 DIAGNOSIS — R0609 Other forms of dyspnea: Secondary | ICD-10-CM | POA: Diagnosis not present

## 2016-05-28 DIAGNOSIS — R7301 Impaired fasting glucose: Secondary | ICD-10-CM | POA: Diagnosis not present

## 2016-05-28 DIAGNOSIS — G8929 Other chronic pain: Secondary | ICD-10-CM | POA: Diagnosis not present

## 2016-05-28 DIAGNOSIS — I480 Paroxysmal atrial fibrillation: Secondary | ICD-10-CM | POA: Diagnosis not present

## 2016-06-11 DIAGNOSIS — Z6826 Body mass index (BMI) 26.0-26.9, adult: Secondary | ICD-10-CM | POA: Diagnosis not present

## 2016-06-11 DIAGNOSIS — Z7901 Long term (current) use of anticoagulants: Secondary | ICD-10-CM | POA: Diagnosis not present

## 2016-06-11 DIAGNOSIS — I480 Paroxysmal atrial fibrillation: Secondary | ICD-10-CM | POA: Diagnosis not present

## 2016-06-11 DIAGNOSIS — L03039 Cellulitis of unspecified toe: Secondary | ICD-10-CM | POA: Diagnosis not present

## 2016-06-15 ENCOUNTER — Encounter: Payer: Medicare Other | Admitting: Podiatry

## 2016-06-15 NOTE — Patient Instructions (Signed)

## 2016-06-16 DIAGNOSIS — Z6826 Body mass index (BMI) 26.0-26.9, adult: Secondary | ICD-10-CM | POA: Diagnosis not present

## 2016-06-16 DIAGNOSIS — G8929 Other chronic pain: Secondary | ICD-10-CM | POA: Diagnosis not present

## 2016-06-16 DIAGNOSIS — Z7901 Long term (current) use of anticoagulants: Secondary | ICD-10-CM | POA: Diagnosis not present

## 2016-06-16 DIAGNOSIS — L03039 Cellulitis of unspecified toe: Secondary | ICD-10-CM | POA: Diagnosis not present

## 2016-06-16 DIAGNOSIS — H8110 Benign paroxysmal vertigo, unspecified ear: Secondary | ICD-10-CM | POA: Diagnosis not present

## 2016-06-16 DIAGNOSIS — I480 Paroxysmal atrial fibrillation: Secondary | ICD-10-CM | POA: Diagnosis not present

## 2016-06-26 NOTE — Progress Notes (Signed)
This encounter was created in error - please disregard.

## 2016-07-02 DIAGNOSIS — I10 Essential (primary) hypertension: Secondary | ICD-10-CM | POA: Diagnosis not present

## 2016-07-02 DIAGNOSIS — J449 Chronic obstructive pulmonary disease, unspecified: Secondary | ICD-10-CM | POA: Diagnosis not present

## 2016-07-19 DIAGNOSIS — R938 Abnormal findings on diagnostic imaging of other specified body structures: Secondary | ICD-10-CM | POA: Diagnosis not present

## 2016-07-19 DIAGNOSIS — R509 Fever, unspecified: Secondary | ICD-10-CM | POA: Diagnosis not present

## 2016-07-19 DIAGNOSIS — Z950 Presence of cardiac pacemaker: Secondary | ICD-10-CM | POA: Diagnosis not present

## 2016-07-21 DIAGNOSIS — Z7901 Long term (current) use of anticoagulants: Secondary | ICD-10-CM | POA: Diagnosis not present

## 2016-07-21 DIAGNOSIS — I480 Paroxysmal atrial fibrillation: Secondary | ICD-10-CM | POA: Diagnosis not present

## 2016-08-18 DIAGNOSIS — G8929 Other chronic pain: Secondary | ICD-10-CM | POA: Diagnosis not present

## 2016-08-18 DIAGNOSIS — I480 Paroxysmal atrial fibrillation: Secondary | ICD-10-CM | POA: Diagnosis not present

## 2016-08-18 DIAGNOSIS — Z7901 Long term (current) use of anticoagulants: Secondary | ICD-10-CM | POA: Diagnosis not present

## 2016-08-31 DIAGNOSIS — R7301 Impaired fasting glucose: Secondary | ICD-10-CM | POA: Diagnosis not present

## 2016-08-31 DIAGNOSIS — M545 Low back pain: Secondary | ICD-10-CM | POA: Diagnosis not present

## 2016-08-31 DIAGNOSIS — R5383 Other fatigue: Secondary | ICD-10-CM | POA: Diagnosis not present

## 2016-08-31 DIAGNOSIS — F3289 Other specified depressive episodes: Secondary | ICD-10-CM | POA: Diagnosis not present

## 2016-08-31 DIAGNOSIS — I480 Paroxysmal atrial fibrillation: Secondary | ICD-10-CM | POA: Diagnosis not present

## 2016-08-31 DIAGNOSIS — Z6826 Body mass index (BMI) 26.0-26.9, adult: Secondary | ICD-10-CM | POA: Diagnosis not present

## 2016-08-31 DIAGNOSIS — R0609 Other forms of dyspnea: Secondary | ICD-10-CM | POA: Diagnosis not present

## 2016-08-31 DIAGNOSIS — J449 Chronic obstructive pulmonary disease, unspecified: Secondary | ICD-10-CM | POA: Diagnosis not present

## 2016-08-31 DIAGNOSIS — H8113 Benign paroxysmal vertigo, bilateral: Secondary | ICD-10-CM | POA: Diagnosis not present

## 2016-08-31 DIAGNOSIS — H9193 Unspecified hearing loss, bilateral: Secondary | ICD-10-CM | POA: Diagnosis not present

## 2016-08-31 DIAGNOSIS — G8929 Other chronic pain: Secondary | ICD-10-CM | POA: Diagnosis not present

## 2016-08-31 DIAGNOSIS — Z1389 Encounter for screening for other disorder: Secondary | ICD-10-CM | POA: Diagnosis not present

## 2016-09-22 DIAGNOSIS — Z7901 Long term (current) use of anticoagulants: Secondary | ICD-10-CM | POA: Diagnosis not present

## 2016-09-22 DIAGNOSIS — I480 Paroxysmal atrial fibrillation: Secondary | ICD-10-CM | POA: Diagnosis not present

## 2016-10-23 DIAGNOSIS — I480 Paroxysmal atrial fibrillation: Secondary | ICD-10-CM | POA: Diagnosis not present

## 2016-10-23 DIAGNOSIS — Z7901 Long term (current) use of anticoagulants: Secondary | ICD-10-CM | POA: Diagnosis not present

## 2016-10-23 DIAGNOSIS — N6310 Unspecified lump in the right breast, unspecified quadrant: Secondary | ICD-10-CM | POA: Diagnosis not present

## 2016-11-24 DIAGNOSIS — I480 Paroxysmal atrial fibrillation: Secondary | ICD-10-CM | POA: Diagnosis not present

## 2016-11-24 DIAGNOSIS — Z7901 Long term (current) use of anticoagulants: Secondary | ICD-10-CM | POA: Diagnosis not present

## 2016-12-14 DIAGNOSIS — J449 Chronic obstructive pulmonary disease, unspecified: Secondary | ICD-10-CM | POA: Diagnosis not present

## 2016-12-14 DIAGNOSIS — I480 Paroxysmal atrial fibrillation: Secondary | ICD-10-CM | POA: Diagnosis not present

## 2016-12-14 DIAGNOSIS — M25562 Pain in left knee: Secondary | ICD-10-CM | POA: Diagnosis not present

## 2016-12-14 DIAGNOSIS — G8929 Other chronic pain: Secondary | ICD-10-CM | POA: Diagnosis not present

## 2016-12-14 DIAGNOSIS — R7301 Impaired fasting glucose: Secondary | ICD-10-CM | POA: Diagnosis not present

## 2016-12-14 DIAGNOSIS — Z6826 Body mass index (BMI) 26.0-26.9, adult: Secondary | ICD-10-CM | POA: Diagnosis not present

## 2016-12-14 DIAGNOSIS — R0609 Other forms of dyspnea: Secondary | ICD-10-CM | POA: Diagnosis not present

## 2016-12-14 DIAGNOSIS — M545 Low back pain: Secondary | ICD-10-CM | POA: Diagnosis not present

## 2016-12-30 DIAGNOSIS — Z7901 Long term (current) use of anticoagulants: Secondary | ICD-10-CM | POA: Diagnosis not present

## 2017-01-27 DIAGNOSIS — J069 Acute upper respiratory infection, unspecified: Secondary | ICD-10-CM | POA: Diagnosis not present

## 2017-01-27 DIAGNOSIS — I480 Paroxysmal atrial fibrillation: Secondary | ICD-10-CM | POA: Diagnosis not present

## 2017-01-27 DIAGNOSIS — J449 Chronic obstructive pulmonary disease, unspecified: Secondary | ICD-10-CM | POA: Diagnosis not present

## 2017-01-27 DIAGNOSIS — G8929 Other chronic pain: Secondary | ICD-10-CM | POA: Diagnosis not present

## 2017-01-27 DIAGNOSIS — Z7901 Long term (current) use of anticoagulants: Secondary | ICD-10-CM | POA: Diagnosis not present

## 2017-03-03 DIAGNOSIS — Z7901 Long term (current) use of anticoagulants: Secondary | ICD-10-CM | POA: Diagnosis not present

## 2017-03-03 DIAGNOSIS — I480 Paroxysmal atrial fibrillation: Secondary | ICD-10-CM | POA: Diagnosis not present

## 2017-03-16 DIAGNOSIS — Z7901 Long term (current) use of anticoagulants: Secondary | ICD-10-CM | POA: Diagnosis not present

## 2017-03-16 DIAGNOSIS — J449 Chronic obstructive pulmonary disease, unspecified: Secondary | ICD-10-CM | POA: Diagnosis not present

## 2017-03-16 DIAGNOSIS — Z1389 Encounter for screening for other disorder: Secondary | ICD-10-CM | POA: Diagnosis not present

## 2017-03-16 DIAGNOSIS — Z6826 Body mass index (BMI) 26.0-26.9, adult: Secondary | ICD-10-CM | POA: Diagnosis not present

## 2017-03-16 DIAGNOSIS — M545 Low back pain: Secondary | ICD-10-CM | POA: Diagnosis not present

## 2017-03-16 DIAGNOSIS — I480 Paroxysmal atrial fibrillation: Secondary | ICD-10-CM | POA: Diagnosis not present

## 2017-03-16 DIAGNOSIS — R7301 Impaired fasting glucose: Secondary | ICD-10-CM | POA: Diagnosis not present

## 2017-03-16 DIAGNOSIS — G8929 Other chronic pain: Secondary | ICD-10-CM | POA: Diagnosis not present

## 2017-03-16 DIAGNOSIS — M25562 Pain in left knee: Secondary | ICD-10-CM | POA: Diagnosis not present

## 2017-04-15 DIAGNOSIS — I480 Paroxysmal atrial fibrillation: Secondary | ICD-10-CM | POA: Diagnosis not present

## 2017-04-15 DIAGNOSIS — Z7901 Long term (current) use of anticoagulants: Secondary | ICD-10-CM | POA: Diagnosis not present

## 2017-05-11 DIAGNOSIS — Z7901 Long term (current) use of anticoagulants: Secondary | ICD-10-CM | POA: Diagnosis not present

## 2017-05-11 DIAGNOSIS — I480 Paroxysmal atrial fibrillation: Secondary | ICD-10-CM | POA: Diagnosis not present

## 2017-06-08 DIAGNOSIS — Z7901 Long term (current) use of anticoagulants: Secondary | ICD-10-CM | POA: Diagnosis not present

## 2017-06-08 DIAGNOSIS — I480 Paroxysmal atrial fibrillation: Secondary | ICD-10-CM | POA: Diagnosis not present

## 2017-06-23 DIAGNOSIS — I480 Paroxysmal atrial fibrillation: Secondary | ICD-10-CM | POA: Diagnosis not present

## 2017-06-23 DIAGNOSIS — Z7901 Long term (current) use of anticoagulants: Secondary | ICD-10-CM | POA: Diagnosis not present

## 2017-06-23 DIAGNOSIS — M545 Low back pain: Secondary | ICD-10-CM | POA: Diagnosis not present

## 2017-06-23 DIAGNOSIS — Z95810 Presence of automatic (implantable) cardiac defibrillator: Secondary | ICD-10-CM | POA: Diagnosis not present

## 2017-07-20 DIAGNOSIS — Z6826 Body mass index (BMI) 26.0-26.9, adult: Secondary | ICD-10-CM | POA: Diagnosis not present

## 2017-07-20 DIAGNOSIS — Z7901 Long term (current) use of anticoagulants: Secondary | ICD-10-CM | POA: Diagnosis not present

## 2017-07-20 DIAGNOSIS — I480 Paroxysmal atrial fibrillation: Secondary | ICD-10-CM | POA: Diagnosis not present

## 2017-07-20 DIAGNOSIS — Z95810 Presence of automatic (implantable) cardiac defibrillator: Secondary | ICD-10-CM | POA: Diagnosis not present

## 2017-08-05 DIAGNOSIS — J449 Chronic obstructive pulmonary disease, unspecified: Secondary | ICD-10-CM | POA: Diagnosis not present

## 2017-08-05 DIAGNOSIS — M25561 Pain in right knee: Secondary | ICD-10-CM | POA: Diagnosis not present

## 2017-08-05 DIAGNOSIS — M25562 Pain in left knee: Secondary | ICD-10-CM | POA: Diagnosis not present

## 2017-08-05 DIAGNOSIS — G8929 Other chronic pain: Secondary | ICD-10-CM | POA: Diagnosis not present

## 2017-08-05 DIAGNOSIS — Z6826 Body mass index (BMI) 26.0-26.9, adult: Secondary | ICD-10-CM | POA: Diagnosis not present

## 2017-08-05 DIAGNOSIS — R7301 Impaired fasting glucose: Secondary | ICD-10-CM | POA: Diagnosis not present

## 2017-08-17 DIAGNOSIS — I480 Paroxysmal atrial fibrillation: Secondary | ICD-10-CM | POA: Diagnosis not present

## 2017-08-17 DIAGNOSIS — Z7901 Long term (current) use of anticoagulants: Secondary | ICD-10-CM | POA: Diagnosis not present

## 2017-08-24 DIAGNOSIS — J449 Chronic obstructive pulmonary disease, unspecified: Secondary | ICD-10-CM | POA: Diagnosis not present

## 2017-08-24 DIAGNOSIS — G8929 Other chronic pain: Secondary | ICD-10-CM | POA: Diagnosis not present

## 2017-08-24 DIAGNOSIS — J069 Acute upper respiratory infection, unspecified: Secondary | ICD-10-CM | POA: Diagnosis not present

## 2017-08-24 DIAGNOSIS — Z6827 Body mass index (BMI) 27.0-27.9, adult: Secondary | ICD-10-CM | POA: Diagnosis not present

## 2017-08-24 DIAGNOSIS — R05 Cough: Secondary | ICD-10-CM | POA: Diagnosis not present

## 2017-09-14 DIAGNOSIS — Z7901 Long term (current) use of anticoagulants: Secondary | ICD-10-CM | POA: Diagnosis not present

## 2017-09-14 DIAGNOSIS — I480 Paroxysmal atrial fibrillation: Secondary | ICD-10-CM | POA: Diagnosis not present

## 2017-10-19 DIAGNOSIS — I480 Paroxysmal atrial fibrillation: Secondary | ICD-10-CM | POA: Diagnosis not present

## 2017-10-19 DIAGNOSIS — Z23 Encounter for immunization: Secondary | ICD-10-CM | POA: Diagnosis not present

## 2017-10-19 DIAGNOSIS — G8929 Other chronic pain: Secondary | ICD-10-CM | POA: Diagnosis not present

## 2017-10-19 DIAGNOSIS — Z7901 Long term (current) use of anticoagulants: Secondary | ICD-10-CM | POA: Diagnosis not present

## 2017-11-18 DIAGNOSIS — J449 Chronic obstructive pulmonary disease, unspecified: Secondary | ICD-10-CM | POA: Diagnosis not present

## 2017-11-18 DIAGNOSIS — J069 Acute upper respiratory infection, unspecified: Secondary | ICD-10-CM | POA: Diagnosis not present

## 2017-11-18 DIAGNOSIS — I480 Paroxysmal atrial fibrillation: Secondary | ICD-10-CM | POA: Diagnosis not present

## 2017-11-18 DIAGNOSIS — R0609 Other forms of dyspnea: Secondary | ICD-10-CM | POA: Diagnosis not present

## 2017-11-18 DIAGNOSIS — Z7901 Long term (current) use of anticoagulants: Secondary | ICD-10-CM | POA: Diagnosis not present

## 2017-11-18 DIAGNOSIS — R05 Cough: Secondary | ICD-10-CM | POA: Diagnosis not present

## 2017-12-16 DIAGNOSIS — Z7901 Long term (current) use of anticoagulants: Secondary | ICD-10-CM | POA: Diagnosis not present

## 2017-12-16 DIAGNOSIS — I480 Paroxysmal atrial fibrillation: Secondary | ICD-10-CM | POA: Diagnosis not present

## 2017-12-16 DIAGNOSIS — G8929 Other chronic pain: Secondary | ICD-10-CM | POA: Diagnosis not present

## 2017-12-28 DIAGNOSIS — E7849 Other hyperlipidemia: Secondary | ICD-10-CM | POA: Diagnosis not present

## 2017-12-28 DIAGNOSIS — R82998 Other abnormal findings in urine: Secondary | ICD-10-CM | POA: Diagnosis not present

## 2017-12-28 DIAGNOSIS — Z125 Encounter for screening for malignant neoplasm of prostate: Secondary | ICD-10-CM | POA: Diagnosis not present

## 2017-12-28 DIAGNOSIS — N183 Chronic kidney disease, stage 3 (moderate): Secondary | ICD-10-CM | POA: Diagnosis not present

## 2017-12-28 DIAGNOSIS — R7301 Impaired fasting glucose: Secondary | ICD-10-CM | POA: Diagnosis not present

## 2018-01-04 DIAGNOSIS — Z6826 Body mass index (BMI) 26.0-26.9, adult: Secondary | ICD-10-CM | POA: Diagnosis not present

## 2018-01-04 DIAGNOSIS — G8929 Other chronic pain: Secondary | ICD-10-CM | POA: Diagnosis not present

## 2018-01-04 DIAGNOSIS — Z0000 Encounter for general adult medical examination without abnormal findings: Secondary | ICD-10-CM | POA: Diagnosis not present

## 2018-01-04 DIAGNOSIS — I480 Paroxysmal atrial fibrillation: Secondary | ICD-10-CM | POA: Diagnosis not present

## 2018-01-04 DIAGNOSIS — F3289 Other specified depressive episodes: Secondary | ICD-10-CM | POA: Diagnosis not present

## 2018-01-04 DIAGNOSIS — J449 Chronic obstructive pulmonary disease, unspecified: Secondary | ICD-10-CM | POA: Diagnosis not present

## 2018-01-04 DIAGNOSIS — M25562 Pain in left knee: Secondary | ICD-10-CM | POA: Diagnosis not present

## 2018-01-04 DIAGNOSIS — M109 Gout, unspecified: Secondary | ICD-10-CM | POA: Diagnosis not present

## 2018-01-04 DIAGNOSIS — M545 Low back pain: Secondary | ICD-10-CM | POA: Diagnosis not present

## 2018-01-04 DIAGNOSIS — Z1212 Encounter for screening for malignant neoplasm of rectum: Secondary | ICD-10-CM | POA: Diagnosis not present

## 2018-01-04 DIAGNOSIS — N183 Chronic kidney disease, stage 3 (moderate): Secondary | ICD-10-CM | POA: Diagnosis not present

## 2018-01-04 DIAGNOSIS — G6289 Other specified polyneuropathies: Secondary | ICD-10-CM | POA: Diagnosis not present

## 2018-01-04 DIAGNOSIS — R7301 Impaired fasting glucose: Secondary | ICD-10-CM | POA: Diagnosis not present

## 2018-01-08 ENCOUNTER — Other Ambulatory Visit: Payer: Self-pay

## 2018-01-08 ENCOUNTER — Emergency Department (HOSPITAL_BASED_OUTPATIENT_CLINIC_OR_DEPARTMENT_OTHER): Payer: Medicare Other

## 2018-01-08 ENCOUNTER — Observation Stay (HOSPITAL_BASED_OUTPATIENT_CLINIC_OR_DEPARTMENT_OTHER)
Admission: EM | Admit: 2018-01-08 | Discharge: 2018-01-09 | Disposition: A | Discharge status: Home / Self Care | Payer: Medicare Other | Attending: Student | Admitting: Student

## 2018-01-08 ENCOUNTER — Encounter (HOSPITAL_BASED_OUTPATIENT_CLINIC_OR_DEPARTMENT_OTHER): Payer: Self-pay | Admitting: Emergency Medicine

## 2018-01-08 DIAGNOSIS — N3289 Other specified disorders of bladder: Secondary | ICD-10-CM | POA: Diagnosis not present

## 2018-01-08 DIAGNOSIS — I129 Hypertensive chronic kidney disease with stage 1 through stage 4 chronic kidney disease, or unspecified chronic kidney disease: Secondary | ICD-10-CM | POA: Diagnosis not present

## 2018-01-08 DIAGNOSIS — M79605 Pain in left leg: Secondary | ICD-10-CM | POA: Insufficient documentation

## 2018-01-08 DIAGNOSIS — Z95810 Presence of automatic (implantable) cardiac defibrillator: Secondary | ICD-10-CM | POA: Diagnosis not present

## 2018-01-08 DIAGNOSIS — M109 Gout, unspecified: Secondary | ICD-10-CM | POA: Diagnosis not present

## 2018-01-08 DIAGNOSIS — Z951 Presence of aortocoronary bypass graft: Secondary | ICD-10-CM | POA: Insufficient documentation

## 2018-01-08 DIAGNOSIS — Z79899 Other long term (current) drug therapy: Secondary | ICD-10-CM | POA: Insufficient documentation

## 2018-01-08 DIAGNOSIS — Z7901 Long term (current) use of anticoagulants: Secondary | ICD-10-CM | POA: Diagnosis not present

## 2018-01-08 DIAGNOSIS — Z952 Presence of prosthetic heart valve: Secondary | ICD-10-CM | POA: Diagnosis not present

## 2018-01-08 DIAGNOSIS — I70209 Unspecified atherosclerosis of native arteries of extremities, unspecified extremity: Secondary | ICD-10-CM

## 2018-01-08 DIAGNOSIS — Z87891 Personal history of nicotine dependence: Secondary | ICD-10-CM | POA: Diagnosis not present

## 2018-01-08 DIAGNOSIS — R0602 Shortness of breath: Secondary | ICD-10-CM | POA: Diagnosis not present

## 2018-01-08 DIAGNOSIS — G629 Polyneuropathy, unspecified: Principal | ICD-10-CM | POA: Insufficient documentation

## 2018-01-08 DIAGNOSIS — R7989 Other specified abnormal findings of blood chemistry: Secondary | ICD-10-CM | POA: Diagnosis not present

## 2018-01-08 DIAGNOSIS — Z7982 Long term (current) use of aspirin: Secondary | ICD-10-CM | POA: Insufficient documentation

## 2018-01-08 DIAGNOSIS — M7989 Other specified soft tissue disorders: Secondary | ICD-10-CM | POA: Diagnosis not present

## 2018-01-08 DIAGNOSIS — I700 Atherosclerosis of aorta: Secondary | ICD-10-CM | POA: Diagnosis not present

## 2018-01-08 DIAGNOSIS — N189 Chronic kidney disease, unspecified: Secondary | ICD-10-CM | POA: Diagnosis not present

## 2018-01-08 DIAGNOSIS — K5909 Other constipation: Secondary | ICD-10-CM | POA: Insufficient documentation

## 2018-01-08 DIAGNOSIS — J849 Interstitial pulmonary disease, unspecified: Secondary | ICD-10-CM | POA: Insufficient documentation

## 2018-01-08 DIAGNOSIS — E785 Hyperlipidemia, unspecified: Secondary | ICD-10-CM | POA: Diagnosis not present

## 2018-01-08 DIAGNOSIS — I081 Rheumatic disorders of both mitral and tricuspid valves: Secondary | ICD-10-CM | POA: Insufficient documentation

## 2018-01-08 DIAGNOSIS — J449 Chronic obstructive pulmonary disease, unspecified: Secondary | ICD-10-CM | POA: Diagnosis not present

## 2018-01-08 DIAGNOSIS — M79604 Pain in right leg: Secondary | ICD-10-CM

## 2018-01-08 DIAGNOSIS — D649 Anemia, unspecified: Secondary | ICD-10-CM | POA: Insufficient documentation

## 2018-01-08 DIAGNOSIS — M79606 Pain in leg, unspecified: Secondary | ICD-10-CM | POA: Diagnosis not present

## 2018-01-08 LAB — COMPREHENSIVE METABOLIC PANEL
ALT: 17 U/L (ref 0–44)
AST: 31 U/L (ref 15–41)
Albumin: 4.2 g/dL (ref 3.5–5.0)
Alkaline Phosphatase: 54 U/L (ref 38–126)
Anion gap: 9 (ref 5–15)
BUN: 60 mg/dL — AB (ref 8–23)
CO2: 27 mmol/L (ref 22–32)
Calcium: 10 mg/dL (ref 8.9–10.3)
Chloride: 101 mmol/L (ref 98–111)
Creatinine, Ser: 1.92 mg/dL — ABNORMAL HIGH (ref 0.61–1.24)
GFR calc Af Amer: 35 mL/min — ABNORMAL LOW (ref >60)
GFR calc non Af Amer: 30 mL/min — ABNORMAL LOW (ref >60)
GLUCOSE: 107 mg/dL — AB (ref 70–99)
Potassium: 4.8 mmol/L (ref 3.5–5.1)
Sodium: 137 mmol/L (ref 135–145)
Total Bilirubin: 1.2 mg/dL (ref 0.3–1.2)
Total Protein: 7.6 g/dL (ref 6.5–8.1)

## 2018-01-08 LAB — CBC WITH DIFFERENTIAL/PLATELET
Abs Immature Granulocytes: 0.02 10*3/uL (ref 0.00–0.07)
Basophils Absolute: 0 10*3/uL (ref 0.0–0.1)
Basophils Relative: 0 %
EOS ABS: 0.2 10*3/uL (ref 0.0–0.5)
Eosinophils Relative: 3 %
HCT: 38.2 % — ABNORMAL LOW (ref 39.0–52.0)
HEMOGLOBIN: 11.8 g/dL — AB (ref 13.0–17.0)
Immature Granulocytes: 0 %
LYMPHS ABS: 0.5 10*3/uL — AB (ref 0.7–4.0)
Lymphocytes Relative: 10 %
MCH: 31.4 pg (ref 26.0–34.0)
MCHC: 30.9 g/dL (ref 30.0–36.0)
MCV: 101.6 fL — ABNORMAL HIGH (ref 80.0–100.0)
Monocytes Absolute: 0.8 10*3/uL (ref 0.1–1.0)
Monocytes Relative: 16 %
NRBC: 0 % (ref 0.0–0.2)
Neutro Abs: 3.7 10*3/uL (ref 1.7–7.7)
Neutrophils Relative %: 71 %
Platelets: 120 10*3/uL — ABNORMAL LOW (ref 150–400)
RBC: 3.76 MIL/uL — ABNORMAL LOW (ref 4.22–5.81)
RDW: 13.2 % (ref 11.5–15.5)
WBC: 5.2 10*3/uL (ref 4.0–10.5)

## 2018-01-08 LAB — PROTIME-INR
INR: 1.93
Prothrombin Time: 21.8 seconds — ABNORMAL HIGH (ref 11.4–15.2)

## 2018-01-08 LAB — D-DIMER, QUANTITATIVE: D-Dimer, Quant: 0.84 ug/mL-FEU — ABNORMAL HIGH (ref 0.00–0.50)

## 2018-01-08 MED ORDER — SODIUM CHLORIDE 0.9 % IV BOLUS
500.0000 mL | Freq: Once | INTRAVENOUS | Status: AC
  Administered 2018-01-08: 500 mL via INTRAVENOUS

## 2018-01-08 MED ORDER — HYDROCODONE-ACETAMINOPHEN 5-325 MG PO TABS
1.0000 | ORAL_TABLET | Freq: Once | ORAL | Status: AC
  Administered 2018-01-08: 1 via ORAL
  Filled 2018-01-08: qty 1

## 2018-01-08 NOTE — ED Notes (Signed)
ED Provider at bedside. Dr. Tamera Punt

## 2018-01-08 NOTE — ED Notes (Signed)
Dr. Tamera Punt at bedside with ultrasound

## 2018-01-08 NOTE — ED Provider Notes (Signed)
[facility] EMERGENCY DEPARTMENT Provider Note   CSN: [id number] Arrival date & time: 01/08/18  1746     History   Chief Complaint Chief Complaint  Patient presents with  . Leg Pain    HPI Ernest Sanchez is a 84 y.o. male.  Patient is a 84 year old male who presents with leg swelling.  Ernest Sanchez has a history of hypertension and hyperlipidemia with some interstitial lung disease.  Ernest Sanchez states that over the last week Ernest Sanchez has had increased swelling in his left leg.  Ernest Sanchez saw his PCP who thought Ernest Sanchez may have some gout and gave him a steroid injection of his knee but has had progressive swelling of the entire left lower leg.  Ernest Sanchez also has had some discoloration and increased pain to the right leg.  Ernest Sanchez denies any abdominal pain.  Ernest Sanchez does have some baseline intermittent shortness of breath which is essentially unchanged from his baseline.  No fevers.  No nausea or vomiting.  No dizziness.  No history of blood clots.  No known history of vascular disease in his lower extremities.  Ernest Sanchez has had some difficulty walking due to the pain in his lower legs.     Past Medical History:  Diagnosis Date  . 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 Hyperlipidemia   . Hypertension   . Peripheral neuropathy     Patient Active Problem List   Diagnosis Date Noted  . Swelling of left lower extremity 01/08/2018    History reviewed. No pertinent surgical history.      Home Medications    Prior to Admission medications   Medication Sig Start Date End Date Taking? Authorizing Provider  albuterol (PROVENTIL HFA;VENTOLIN HFA) 108 (90 BASE) MCG/ACT inhaler Inhale into the lungs every 6 (six) hours as needed for wheezing or shortness of breath.    [provider]  ALBUTEROL SULFATE HFA IN Inhale into the lungs.    [provider]  allopurinol (ZYLOPRIM) 100 MG tablet Take 200 mg by mouth daily.    [provider]  aspirin 81 MG tablet Take 81 mg by mouth daily.    [provider]  citalopram  (CELEXA) 40 MG tablet Take 40 mg by mouth daily.    [provider]  dorzolamide (TRUSOPT) 2 % ophthalmic solution 1 drop 2 (two) times daily.    [provider]  furosemide (LASIX) 40 MG tablet Take 40 mg by mouth 2 (two) times daily.    [provider]  gabapentin (NEURONTIN) 300 MG capsule Take 300 mg by mouth 3 (three) times daily.    [provider]  HYDROcodone-acetaminophen (NORCO/VICODIN) 5-325 MG per tablet Take 1-2 tablets by mouth.    [provider]  latanoprost (XALATAN) 0.005 % ophthalmic solution 1 drop at bedtime.    [provider]  levothyroxine (SYNTHROID, LEVOTHROID) 100 MCG tablet Take 100 mcg by mouth daily before breakfast.    [provider]  lisinopril (PRINIVIL,ZESTRIL) 40 MG tablet Take 40 mg by mouth daily.    [provider]  metoprolol (TOPROL-XL) 200 MG 24 hr tablet Take 100 mg by mouth daily.    [provider]  mirtazapine (REMERON) 15 MG tablet Take 15 mg by mouth at bedtime.    [provider]  omeprazole (PRILOSEC) 20 MG capsule Take 20 mg by mouth daily.    [provider]  temazepam (RESTORIL) 7.5 MG capsule Take 7.5 mg by mouth at bedtime as needed for sleep.    [provider]  tiotropium (SPIRIVA HANDIHALER) 18 MCG inhalation capsule Place into inhaler and inhale.    [provider]  warfarin (COUMADIN) 4 MG tablet Take by mouth.    [provider]    Family History History reviewed. No pertinent family history.  Social History Social History   Tobacco Use  . Smoking status: Former Research scientist (life sciences)  . Smokeless tobacco: Never Used  Substance Use Topics  . Alcohol use: Never    Frequency: Never  . Drug use: Never     Allergies   Patient has no known allergies.   Review of Systems Review of Systems  Constitutional: Negative for chills, diaphoresis, fatigue and fever.  HENT: Negative for congestion, rhinorrhea and sneezing.     Eyes: Negative.   Respiratory: Positive for shortness of breath. Negative for cough and chest tightness.   Cardiovascular: Positive for leg swelling. Negative for chest pain.  Gastrointestinal: Negative for abdominal pain, blood in stool, diarrhea, nausea and vomiting.  Genitourinary: Negative for difficulty urinating, flank pain, frequency and hematuria.  Musculoskeletal: Negative for arthralgias and back pain.  Skin: Negative for rash.  Neurological: Negative for dizziness, speech difficulty, weakness, numbness and headaches.     Physical Exam Updated Vital Signs BP 112/65   Pulse 73   Temp 97.8 F (36.6 C) (Oral)   Resp 16   Ht 6' (1.829 m)   Wt 82.6 kg   SpO2 99%   BMI 24.68 kg/m   Physical Exam  Constitutional: Ernest Sanchez is oriented to person, place, and time. Ernest Sanchez appears well-developed and well-nourished.  HENT:  Head: Normocephalic and atraumatic.  Eyes: Pupils are equal, round, and reactive to light.  Neck: Normal range of motion. Neck supple.  Cardiovascular: Normal rate, regular rhythm and normal heart sounds.  Pulmonary/Chest: Effort normal and breath sounds normal. No respiratory distress. Ernest Sanchez has no wheezes. Ernest Sanchez has no rales. Ernest Sanchez exhibits no tenderness.  Abdominal: Soft. Bowel sounds are normal. There is no tenderness. There is no rebound and no guarding.  Musculoskeletal: Normal range of motion. Ernest Sanchez exhibits edema.  Patient has 3+ pitting edema to the left lower extremity from the knee down to the foot.  There is some ecchymosis to the posterior aspect of the leg and into the foot.  There is some mild warmth but no significant erythema.  There is some generalized tenderness on palpation of the leg.  Pedal pulses are palpated in the left leg.  The right leg has minimal edema but has purplish discoloration and coolness to the foot and ankle.  I am unable to palpate pulses in the foot.  I was able to obtain a posterior tibial pulse by Doppler in the foot.  Ernest Sanchez does have a palpable  femoral pulse and a dopplerable popliteal pulse.  Lymphadenopathy:    Ernest Sanchez has no cervical adenopathy.  Neurological: Ernest Sanchez is alert and oriented to person, place, and time.  Skin: Skin is warm and dry. No rash noted.  Psychiatric: Ernest Sanchez has a normal mood and affect.     ED Treatments / Results  Labs (all labs ordered are listed, but only abnormal results are displayed) Labs Reviewed  CBC WITH DIFFERENTIAL/PLATELET - Abnormal; Notable for the following components:      Result Value   RBC 3.76 (*)    Hemoglobin 11.8 (*)    HCT 38.2 (*)    MCV 101.6 (*)    Platelets 120 (*)    Lymphs Abs 0.5 (*)    All other components within normal  limits  D-DIMER, QUANTITATIVE (NOT AT [facility]) - Abnormal; Notable for the following components:   D-Dimer, Quant 0.84 (*)    All other components within normal limits  COMPREHENSIVE METABOLIC PANEL - Abnormal; Notable for the following components:   Glucose, Bld 107 (*)    BUN 60 (*)    Creatinine, Ser 1.92 (*)    GFR calc non Af Amer 30 (*)    GFR calc Af Amer 35 (*)    All other components within normal limits  PROTIME-INR - Abnormal; Notable for the following components:   Prothrombin Time 21.8 (*)    All other components within normal limits    EKG EKG Interpretation  Date/Time:  Saturday January 08 2018 18:00:54 EST Ventricular Rate:  82 PR Interval:    QRS Duration: 164 QT Interval:  442 QTC Calculation: 516 R Axis:   -92 Text Interpretation:  Ventricular-paced rhythm Biventricular pacemaker detected Abnormal ECG Confirmed by Malvin Johns [phone] on 01/08/2018 8:12:13 PM   Radiology Dg Chest 2 View  Result Date: 01/08/2018 CLINICAL DATA:  Intermittent LEFT lower extremity swelling with chest pain. History of pneumonia and chronic interstitial lung disease. EXAM: CHEST - 2 VIEW COMPARISON:  None. FINDINGS: Elevated LEFT hemidiaphragm. Cardiac silhouette is mild-to-moderately enlarged. Pulmonary vascular congestion. Fullness of bilateral  hilar with interstitial prominence. No pleural effusion or focal consolidation. LEFT AICD with lead tips projecting RIGHT atrium and bilateral ventricles. Status post CABG. Calcified aortic arch. Biapical pleural thickening. No pneumothorax.Age indeterminate LEFT rib fractures. RIGHT ninth rib lucency. IMPRESSION: Cardiomegaly and vascular congestion. Age indeterminate interstitial prominence. Fullness of the hila could reflect vascular shadows though lymphadenopathy is possible. Recommend non emergent CT chest with contrast. Age indeterminate LEFT rib fractures. Possible lytic lesion RIGHT rib versus projectional artifact. Electronically Signed   By: Elon Alas M.D.   On: 01/08/2018 19:31    Procedures Procedures (including critical care time)  Medications Ordered in ED Medications  HYDROcodone-acetaminophen (NORCO/VICODIN) 5-325 MG per tablet 1 tablet (has no administration in time range)  sodium chloride 0.9 % bolus 500 mL ( Intravenous Stopped 01/08/18 2220)     Initial Impression / Assessment and Plan / ED Course  I have reviewed the triage vital signs and the nursing notes.  Pertinent labs & imaging results that were available during my care of the patient were reviewed by me and considered in my medical decision making (see chart for details).     Patient is a 84 year old male who presents with leg swelling.  Ernest Sanchez has a swollen tender leg on the left.  There is some ecchymosis.  There is not significant warmth or erythema that would be more concerning for cellulitis.  I am concerned about a possible DVT.  Ernest Sanchez also has some concerning findings with his right leg.  Ernest Sanchez has some discoloration and coolness to the right lower leg without significant swelling.  I am unable to palpate pulses in this leg but Ernest Sanchez does have a dopplerable posterior tibial pulse on the right.  Palpable pulses are present on the left.  Unfortunately I am unable to get any ultrasounds at our facility tonight.  I try  to get a CT scan of his pelvis and lower extremities to assess both arterial and venous flow.  However patient's creatinine is preventing this from being down.  Patient has difficulty walking to the pain in his legs.  Ernest Sanchez lives alone.  I spoke with Dr. Marlowe Sax with the hospitalist service Ernest Sanchez will admit the patient to  [facility] and do imaging in the morning.  Given that Ernest Sanchez has pulses in both feet I did not feel Ernest Sanchez has a vascular emergency.  I did find out just prior to transfer to [facility] that Ernest Sanchez is on Coumadin.  His INR is pending.  Ernest Sanchez does not have any complaints of shortness of breath currently.  Ernest Sanchez has some intermittent shortness of breath was his underlying lung disease which does not seem any different than his baseline status.  Ernest Sanchez has no hypoxia or ongoing tachycardia which would be more concerning for PE.  Final Clinical Impressions(s) / ED Diagnoses   Final diagnoses:  Bilateral leg pain  Leg swelling    ED Discharge Orders    None       Malvin Johns, MD 01/08/18 2242

## 2018-01-08 NOTE — ED Triage Notes (Addendum)
Patient states that he has had swelling to his left lower leg x 1 week - patients lower leg is red and swollen  - pain to the back of his leg per the patient . Positive homens sign  - patient also complains of SOB and intermittent chest pain

## 2018-01-08 NOTE — ED Notes (Signed)
[facility] here for transport 

## 2018-01-09 ENCOUNTER — Observation Stay (HOSPITAL_BASED_OUTPATIENT_CLINIC_OR_DEPARTMENT_OTHER): Payer: Medicare Other

## 2018-01-09 ENCOUNTER — Observation Stay (HOSPITAL_COMMUNITY): Payer: Medicare Other

## 2018-01-09 ENCOUNTER — Encounter (HOSPITAL_COMMUNITY): Payer: Medicare Other

## 2018-01-09 ENCOUNTER — Encounter (HOSPITAL_COMMUNITY): Payer: Self-pay | Admitting: Radiology

## 2018-01-09 DIAGNOSIS — E785 Hyperlipidemia, unspecified: Secondary | ICD-10-CM | POA: Diagnosis not present

## 2018-01-09 DIAGNOSIS — I081 Rheumatic disorders of both mitral and tricuspid valves: Secondary | ICD-10-CM | POA: Diagnosis not present

## 2018-01-09 DIAGNOSIS — D649 Anemia, unspecified: Secondary | ICD-10-CM | POA: Diagnosis not present

## 2018-01-09 DIAGNOSIS — R918 Other nonspecific abnormal finding of lung field: Secondary | ICD-10-CM

## 2018-01-09 DIAGNOSIS — M79609 Pain in unspecified limb: Secondary | ICD-10-CM

## 2018-01-09 DIAGNOSIS — M660 Rupture of popliteal cyst: Secondary | ICD-10-CM

## 2018-01-09 DIAGNOSIS — I351 Nonrheumatic aortic (valve) insufficiency: Secondary | ICD-10-CM | POA: Diagnosis not present

## 2018-01-09 DIAGNOSIS — M79605 Pain in left leg: Secondary | ICD-10-CM | POA: Diagnosis not present

## 2018-01-09 DIAGNOSIS — Z01810 Encounter for preprocedural cardiovascular examination: Secondary | ICD-10-CM | POA: Diagnosis not present

## 2018-01-09 DIAGNOSIS — N189 Chronic kidney disease, unspecified: Secondary | ICD-10-CM | POA: Diagnosis not present

## 2018-01-09 DIAGNOSIS — R7989 Other specified abnormal findings of blood chemistry: Secondary | ICD-10-CM

## 2018-01-09 DIAGNOSIS — J449 Chronic obstructive pulmonary disease, unspecified: Secondary | ICD-10-CM | POA: Diagnosis not present

## 2018-01-09 DIAGNOSIS — I739 Peripheral vascular disease, unspecified: Secondary | ICD-10-CM | POA: Diagnosis not present

## 2018-01-09 DIAGNOSIS — M7989 Other specified soft tissue disorders: Secondary | ICD-10-CM | POA: Diagnosis not present

## 2018-01-09 DIAGNOSIS — I70209 Unspecified atherosclerosis of native arteries of extremities, unspecified extremity: Secondary | ICD-10-CM

## 2018-01-09 DIAGNOSIS — I700 Atherosclerosis of aorta: Secondary | ICD-10-CM | POA: Diagnosis not present

## 2018-01-09 DIAGNOSIS — I5022 Chronic systolic (congestive) heart failure: Secondary | ICD-10-CM

## 2018-01-09 DIAGNOSIS — I129 Hypertensive chronic kidney disease with stage 1 through stage 4 chronic kidney disease, or unspecified chronic kidney disease: Secondary | ICD-10-CM | POA: Diagnosis not present

## 2018-01-09 DIAGNOSIS — R0602 Shortness of breath: Secondary | ICD-10-CM | POA: Diagnosis not present

## 2018-01-09 DIAGNOSIS — I10 Essential (primary) hypertension: Secondary | ICD-10-CM | POA: Diagnosis not present

## 2018-01-09 DIAGNOSIS — J849 Interstitial pulmonary disease, unspecified: Secondary | ICD-10-CM | POA: Diagnosis not present

## 2018-01-09 DIAGNOSIS — G629 Polyneuropathy, unspecified: Secondary | ICD-10-CM | POA: Diagnosis not present

## 2018-01-09 LAB — ECHOCARDIOGRAM COMPLETE
Height: 72 in
Weight: 2836 oz

## 2018-01-09 LAB — HEMOGLOBIN AND HEMATOCRIT, BLOOD
HCT: 36.3 % — ABNORMAL LOW (ref 39.0–52.0)
Hemoglobin: 11.3 g/dL — ABNORMAL LOW (ref 13.0–17.0)

## 2018-01-09 LAB — BASIC METABOLIC PANEL
Anion gap: 9 (ref 5–15)
BUN: 50 mg/dL — AB (ref 8–23)
CO2: 26 mmol/L (ref 22–32)
Calcium: 9.2 mg/dL (ref 8.9–10.3)
Chloride: 102 mmol/L (ref 98–111)
Creatinine, Ser: 1.85 mg/dL — ABNORMAL HIGH (ref 0.61–1.24)
GFR calc Af Amer: 37 mL/min — ABNORMAL LOW (ref >60)
GFR calc non Af Amer: 32 mL/min — ABNORMAL LOW (ref >60)
Glucose, Bld: 108 mg/dL — ABNORMAL HIGH (ref 70–99)
Potassium: 4.4 mmol/L (ref 3.5–5.1)
Sodium: 137 mmol/L (ref 135–145)

## 2018-01-09 LAB — FERRITIN: FERRITIN: 56 ng/mL (ref 24–336)

## 2018-01-09 LAB — RETICULOCYTES
Immature Retic Fract: 13 % (ref 2.3–15.9)
RBC.: 3.42 MIL/uL — ABNORMAL LOW (ref 4.22–5.81)
Retic Count, Absolute: 56.4 10*3/uL (ref 19.0–186.0)
Retic Ct Pct: 1.7 % (ref 0.4–3.1)

## 2018-01-09 LAB — FOLATE: Folate: 15.5 ng/mL (ref >5.9)

## 2018-01-09 LAB — IRON AND TIBC
Iron: 80 ug/dL (ref 45–182)
Saturation Ratios: 21 % (ref 17.9–39.5)
TIBC: 379 ug/dL (ref 250–450)
UIBC: 299 ug/dL

## 2018-01-09 LAB — VITAMIN B12: Vitamin B-12: 360 pg/mL (ref 180–914)

## 2018-01-09 LAB — BRAIN NATRIURETIC PEPTIDE: B Natriuretic Peptide: 146.3 pg/mL — ABNORMAL HIGH (ref 0.0–100.0)

## 2018-01-09 MED ORDER — HEPARIN (PORCINE) 25000 UT/250ML-% IV SOLN
1400.0000 [IU]/h | INTRAVENOUS | Status: DC
  Filled 2018-01-09: qty 250

## 2018-01-09 MED ORDER — ALBUTEROL SULFATE (2.5 MG/3ML) 0.083% IN NEBU: 2.5000 mg | INHALATION_SOLUTION | Freq: Four times a day (QID) | RESPIRATORY_TRACT | Status: DC | PRN

## 2018-01-09 MED ORDER — ALUM & MAG HYDROXIDE-SIMETH 200-200-20 MG/5ML PO SUSP
30.0000 mL | ORAL | Status: DC | PRN
  Administered 2018-01-09: 30 mL via ORAL

## 2018-01-09 MED ORDER — HEPARIN (PORCINE) 25000 UT/250ML-% IV SOLN
1400.0000 [IU]/h | INTRAVENOUS | Status: DC
  Administered 2018-01-09: 1400 [IU]/h via INTRAVENOUS
  Filled 2018-01-09: qty 250

## 2018-01-09 MED ORDER — PANTOPRAZOLE SODIUM 40 MG PO TBEC
40.0000 mg | DELAYED_RELEASE_TABLET | Freq: Every day | ORAL | Status: DC
  Administered 2018-01-09: 40 mg via ORAL
  Filled 2018-01-09: qty 1

## 2018-01-09 MED ORDER — ENOXAPARIN SODIUM 30 MG/0.3ML ~~LOC~~ SOLN
30.0000 mg | SUBCUTANEOUS | Status: DC
  Administered 2018-01-09: 30 mg via SUBCUTANEOUS
  Filled 2018-01-09: qty 0.3

## 2018-01-09 MED ORDER — METOPROLOL SUCCINATE ER 100 MG PO TB24
100.0000 mg | ORAL_TABLET | Freq: Every day | ORAL | Status: DC
  Administered 2018-01-09: 100 mg via ORAL
  Filled 2018-01-09: qty 1

## 2018-01-09 MED ORDER — ALLOPURINOL 100 MG PO TABS
200.0000 mg | ORAL_TABLET | Freq: Every day | ORAL | Status: DC
  Administered 2018-01-09: 200 mg via ORAL
  Filled 2018-01-09: qty 2

## 2018-01-09 MED ORDER — WARFARIN SODIUM 6 MG PO TABS
6.0000 mg | ORAL_TABLET | Freq: Once | ORAL | Status: DC
  Filled 2018-01-09: qty 1

## 2018-01-09 MED ORDER — ALBUTEROL SULFATE HFA 108 (90 BASE) MCG/ACT IN AERS: 1.0000 | INHALATION_SPRAY | Freq: Four times a day (QID) | RESPIRATORY_TRACT | Status: DC | PRN

## 2018-01-09 MED ORDER — GABAPENTIN 300 MG PO CAPS
300.0000 mg | ORAL_CAPSULE | Freq: Three times a day (TID) | ORAL | Status: DC
  Administered 2018-01-09 (×2): 300 mg via ORAL
  Filled 2018-01-09 (×2): qty 1

## 2018-01-09 MED ORDER — ENOXAPARIN SODIUM 30 MG/0.3ML ~~LOC~~ SOLN: 30.0000 mg | SUBCUTANEOUS | Status: DC

## 2018-01-09 MED ORDER — POLYETHYLENE GLYCOL 3350 17 GM/SCOOP PO POWD: ORAL | 0 refills | Status: DC

## 2018-01-09 MED ORDER — CITALOPRAM HYDROBROMIDE 40 MG PO TABS
40.0000 mg | ORAL_TABLET | Freq: Every day | ORAL | Status: DC
  Administered 2018-01-09: 40 mg via ORAL
  Filled 2018-01-09: qty 1

## 2018-01-09 MED ORDER — MIRTAZAPINE 15 MG PO TABS: 15.0000 mg | ORAL_TABLET | Freq: Every day | ORAL | Status: DC

## 2018-01-09 MED ORDER — HYDROCODONE-ACETAMINOPHEN 5-325 MG PO TABS
1.0000 | ORAL_TABLET | Freq: Four times a day (QID) | ORAL | Status: DC | PRN
  Administered 2018-01-09 (×2): 1 via ORAL
  Filled 2018-01-09 (×2): qty 1

## 2018-01-09 MED ORDER — ENOXAPARIN SODIUM 120 MG/0.8ML ~~LOC~~ SOLN: 1.5000 mg/kg | SUBCUTANEOUS | 0 refills | Status: DC

## 2018-01-09 MED ORDER — IOPAMIDOL (ISOVUE-370) INJECTION 76%
INTRAVENOUS | Status: AC
  Filled 2018-01-09: qty 100

## 2018-01-09 MED ORDER — ENOXAPARIN SODIUM 120 MG/0.8ML ~~LOC~~ SOLN
1.5000 mg/kg | SUBCUTANEOUS | Status: DC
  Filled 2018-01-09: qty 0.8

## 2018-01-09 MED ORDER — DICLOFENAC SODIUM 1 % TD GEL: 2.0000 g | Freq: Four times a day (QID) | TRANSDERMAL | 0 refills | Status: DC

## 2018-01-09 MED ORDER — FLEET ENEMA 7-19 GM/118ML RE ENEM: 1.0000 | ENEMA | Freq: Every day | RECTAL | Status: DC | PRN

## 2018-01-09 MED ORDER — LEVOTHYROXINE SODIUM 100 MCG PO TABS
100.0000 ug | ORAL_TABLET | ORAL | Status: DC
  Administered 2018-01-09: 100 ug via ORAL
  Filled 2018-01-09: qty 1

## 2018-01-09 MED ORDER — WARFARIN - PHARMACIST DOSING INPATIENT: Freq: Every day | Status: DC

## 2018-01-09 MED ORDER — ENOXAPARIN SODIUM 120 MG/0.8ML ~~LOC~~ SOLN
1.5000 mg/kg | Freq: Once | SUBCUTANEOUS | Status: AC
  Administered 2018-01-09: 120 mg via SUBCUTANEOUS
  Filled 2018-01-09: qty 0.8

## 2018-01-09 MED ORDER — ACETAMINOPHEN 325 MG PO TABS: 650.0000 mg | ORAL_TABLET | Freq: Four times a day (QID) | ORAL | Status: DC | PRN

## 2018-01-09 MED ORDER — ASPIRIN 81 MG PO CHEW
81.0000 mg | CHEWABLE_TABLET | Freq: Every day | ORAL | Status: DC
  Administered 2018-01-09: 81 mg via ORAL
  Filled 2018-01-09: qty 1

## 2018-01-09 MED ORDER — UMECLIDINIUM BROMIDE 62.5 MCG/INH IN AEPB
1.0000 | INHALATION_SPRAY | Freq: Every day | RESPIRATORY_TRACT | Status: DC
  Filled 2018-01-09: qty 7

## 2018-01-09 MED ORDER — TEMAZEPAM 7.5 MG PO CAPS: 7.5000 mg | ORAL_CAPSULE | Freq: Every evening | ORAL | Status: DC | PRN

## 2018-01-09 MED ORDER — IOPAMIDOL (ISOVUE-370) INJECTION 76%
80.0000 mL | Freq: Once | INTRAVENOUS | Status: AC | PRN
  Administered 2018-01-09: 80 mL via INTRAVENOUS

## 2018-01-09 MED ORDER — ACETAMINOPHEN 650 MG RE SUPP: 650.0000 mg | Freq: Four times a day (QID) | RECTAL | Status: DC | PRN

## 2018-01-09 NOTE — Progress Notes (Signed)
Discharge: Pt d/c from room via wheelchair, Family member with the pt. Discharge instructions given to the patient and family members.  No questions from pt, reintegrated to the pt to call or go to the ED for chest discomfort. Pt dressed in street clothes and left with discharge papers and prescriptions in hand. IV d/ced,  and no complaints of pain or discomfort. 

## 2018-01-09 NOTE — Plan of Care (Signed)
  Problem: Education: Goal: Knowledge of General Education information will improve Description: Including pain rating scale, medication(s)/side effects and non-pharmacologic comfort measures Outcome: Progressing   Problem: Activity: Goal: Risk for activity intolerance will decrease Outcome: Progressing   Problem: Nutrition: Goal: Adequate nutrition will be maintained Outcome: Progressing   Problem: Coping: Goal: Level of anxiety will decrease Outcome: Progressing   

## 2018-01-09 NOTE — Progress Notes (Signed)
ANTICOAGULATION CONSULT NOTE - Initial Consult  Pharmacy Consult for coumadin, now change to heparin for DVT Indication: MVR  No Known Allergies  Patient Measurements: Height: 6' (182.9 cm) Weight: 177 lb 4 oz (80.4 kg) IBW/kg (Calculated) : 77.6   Vital Signs: Temp: 98.4 F (36.9 C) (12/01 0011) Temp Source: Oral (12/01 0011) BP: 136/84 (12/01 0011) Pulse Rate: 94 (12/01 0011)  Labs: Recent Labs    01/08/18 1927  HGB 11.8*  HCT 38.2*  PLT 120*  LABPROT 21.8*  INR 1.93  CREATININE 1.92*    Estimated Creatinine Clearance: 28.6 mL/min (A) (by C-G formula based on SCr of 1.92 mg/dL (H)).   Medical History: Past Medical History:  Diagnosis Date  . 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 Hyperlipidemia   . Hypertension   . Peripheral neuropathy     Medications:  See medication history  Assessment: 84 yo man admitted with leg pain to continue coumadin.  INR earlier tonight 1.93  His d dimer is 0.84 with concerns for possible DVT. Unsure of coumadin dose or last dose of coumadin.  Positive for DVT so will dc coumadin and start heparin Goal of Therapy:  Heparin level 0.3-0.7 units/ml Monitor platelets by anticoagulation protocol: Yes   Plan:  Heparin drip at 1400 units/hr no bolus Check heparin level 8 hours after start Daily HL and CBC Monitor for bleeding complications  Thanks for allowing pharmacy to be a part of this patient's care.  Excell Seltzer, PharmD Clinical Pharmacist  01/09/2018,5:10 AM

## 2018-01-09 NOTE — Consult Note (Signed)
Hospital Consult    Reason for Consult:  Bilateral leg pain Referring Physician:  Hospitalist MRN #:  [id number]  History of Present Illness: This is a 84 y.o. male with a history of hypertension and is on Coumadin.  He is transferred from [facility] with left lower extremity swelling and a CTA that demonstrates popliteal occlusion on the right.  INR is 1.9.  He does have chronic kidney disease with a creatinine near 2 today.  Does not have a history of DVT.  States left leg is been hurting for 2 weeks mostly around the knee.  Denies any fevers.  Right foot he has had ingrown toenail repair not having much pain there.  He does walk.  He is a non-smoker at this time.  Does take aspirin per report.  Past Medical History:  Diagnosis Date  . 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 Hyperlipidemia   . Hypertension   . Peripheral neuropathy    History reviewed. No pertinent surgical history.  No Known Allergies  Prior to Admission medications   Medication Sig Start Date End Date Taking? Authorizing Provider  albuterol (PROVENTIL HFA;VENTOLIN HFA) 108 (90 BASE) MCG/ACT inhaler Inhale into the lungs every 6 (six) hours as needed for wheezing or shortness of breath.    [provider]  ALBUTEROL SULFATE HFA IN Inhale into the lungs.    [provider]  allopurinol (ZYLOPRIM) 100 MG tablet Take 200 mg by mouth daily.    [provider]  aspirin 81 MG tablet Take 81 mg by mouth daily.    [provider]  citalopram (CELEXA) 40 MG tablet Take 40 mg by mouth daily.    [provider]  dorzolamide (TRUSOPT) 2 % ophthalmic solution 1 drop 2 (two) times daily.    [provider]  furosemide (LASIX) 40 MG tablet Take 40 mg by mouth 2 (two) times daily.    [provider]  gabapentin (NEURONTIN) 300 MG capsule Take 300 mg by mouth 3 (three) times daily.    [provider]  HYDROcodone-acetaminophen (NORCO/VICODIN) 5-325 MG per tablet Take  1-2 tablets by mouth.    [provider]  latanoprost (XALATAN) 0.005 % ophthalmic solution 1 drop at bedtime.    [provider]  levothyroxine (SYNTHROID, LEVOTHROID) 100 MCG tablet Take 100 mcg by mouth daily before breakfast.    [provider]  lisinopril (PRINIVIL,ZESTRIL) 40 MG tablet Take 40 mg by mouth daily.    [provider]  metoprolol (TOPROL-XL) 200 MG 24 hr tablet Take 100 mg by mouth daily.    [provider]  mirtazapine (REMERON) 15 MG tablet Take 15 mg by mouth at bedtime.    [provider]  omeprazole (PRILOSEC) 20 MG capsule Take 20 mg by mouth daily.    [provider]  temazepam (RESTORIL) 7.5 MG capsule Take 7.5 mg by mouth at bedtime as needed for sleep.    [provider]  tiotropium (SPIRIVA HANDIHALER) 18 MCG inhalation capsule Place into inhaler and inhale.    [provider]  warfarin (COUMADIN) 4 MG tablet Take by mouth.    [provider]    Social History   Socioeconomic History  . Marital status: Legally Separated    Spouse name: Not on file  . Number of children: Not on file  . Years of education: Not on file  . Highest education level: Not on file  Occupational History  . Not on file  Social  Needs  . Financial resource strain: Not on file  . Food insecurity:    Worry: Not on file    Inability: Not on file  . Transportation needs:    Medical: Not on file    Non-medical: Not on file  Tobacco Use  . Smoking status: Former Research scientist (life sciences)  . Smokeless tobacco: Never Used  Substance and Sexual Activity  . Alcohol use: Never    Frequency: Never  . Drug use: Never  . Sexual activity: Not on file  Lifestyle  . Physical activity:    Days per week: Not on file    Minutes per session: Not on file  . Stress: Not on file  Relationships  . Social connections:    Talks on phone: Not on file    Gets together: Not on file    Attends religious service: Not on file     Active member of club or organization: Not on file    Attends meetings of clubs or organizations: Not on file    Relationship status: Not on file  . Intimate partner violence:    Fear of current or ex partner: Not on file    Emotionally abused: Not on file    Physically abused: Not on file    Forced sexual activity: Not on file  Other Topics Concern  . Not on file  Social History Narrative  . Not on file     History reviewed. No pertinent family history.  ROS:  Cardiovascular: []  chest pain/pressure []  palpitations []  SOB lying flat []  DOE []  pain in legs while walking [x]  pain in legs at rest []  pain in legs at night []  non-healing ulcers [-] hx of DVT [x]  swelling in legs  Pulmonary: []  productive cough []  asthma/wheezing []  home O2  Neurologic: []  weakness in []  arms []  legs []  numbness in []  arms []  legs []  hx of CVA []  mini stroke [] difficulty speaking or slurred speech []  temporary loss of vision in one eye []  dizziness  Hematologic: []  hx of cancer []  bleeding problems []  problems with blood clotting easily  Endocrine:   []  diabetes []  thyroid disease  GI []  vomiting blood []  blood in stool  GU: []  CKD/renal failure []  HD--[]  M/W/F or []  T/T/S []  burning with urination []  blood in urine  Psychiatric: []  anxiety []  depression  Musculoskeletal: []  arthritis []  joint pain  Integumentary: []  rashes []  ulcers  Constitutional: []  fever []  chills   Physical Examination  Vitals:   01/09/18 0011 01/09/18 0554  BP: 136/84 123/69  Pulse: 94 77  Resp: 14 15  Temp: 98.4 F (36.9 C) 98.6 F (37 C)  SpO2: 95% 95%   Body mass index is 24.04 kg/m.  General:  NAD HENT: WNL, normocephalic Pulmonary: normal non-labored breathing Cardiac: Palpable radial pulses bilaterally Palpable femoral pulses bilaterally Palpable popliteal on the right, I cannot palpate on the left due to pain I cannot palpate pedal pulses on the right but on the  left he is a palpable dorsalis pedis pulse Abdomen: soft, NT/ND, no masses Extremities: Left leg has edema without ecchymosis.  Compartments are soft Right foot has a bandage on the right toe.  His foot appears warm and well-perfused and is sensorimotor intact on the right Neurologic: A&O X 3; Appropriate Affect ; SENSATION: normal; MOTOR FUNCTION:  moving all extremities equally. Speech is fluent/normal   CBC    Component Value Date/Time   WBC 5.2 01/08/2018 1927   RBC 3.76 (L) 01/08/2018  1927   HGB 11.8 (L) 01/08/2018 1927   HGB 13.2 04/10/2009 1412   HCT 38.2 (L) 01/08/2018 1927   HCT 41.3 04/10/2009 1412   PLT 120 (L) 01/08/2018 1927   PLT 149 04/10/2009 1412   MCV 101.6 (H) 01/08/2018 1927   MCV 88.4 04/10/2009 1412   MCH 31.4 01/08/2018 1927   MCHC 30.9 01/08/2018 1927   RDW 13.2 01/08/2018 1927   RDW 16.1 (H) 04/10/2009 1412   LYMPHSABS 0.5 (L) 01/08/2018 1927   LYMPHSABS 1.0 04/10/2009 1412   MONOABS 0.8 01/08/2018 1927   MONOABS 0.8 04/10/2009 1412   EOSABS 0.2 01/08/2018 1927   EOSABS 0.2 04/10/2009 1412   BASOSABS 0.0 01/08/2018 1927   BASOSABS 0.0 04/10/2009 1412    BMET    Component Value Date/Time   NA 137 01/08/2018 1927   K 4.8 01/08/2018 1927   CL 101 01/08/2018 1927   CO2 27 01/08/2018 1927   GLUCOSE 107 (H) 01/08/2018 1927   BUN 60 (H) 01/08/2018 1927   CREATININE 1.92 (H) 01/08/2018 1927   CALCIUM 10.0 01/08/2018 1927   GFRNONAA 30 (L) 01/08/2018 1927   GFRAA 35 (L) 01/08/2018 1927    COAGS: Lab Results  Component Value Date   INR 1.93 01/08/2018     Non-Invasive Vascular Imaging:   CTA  IMPRESSION: VASCULAR  1. Right lower extremity arterial occlusion from the popliteal artery distally. Mild to moderate atherosclerosis of the proximal right leg vasculature without additional sites of stenosis. 2. Diffuse aortic and peripheral vessel atherosclerosis without additional site of flow-limiting stenosis or acute arterial findings. 3.  Intramuscular stranding in the left lower extremity including stranding about the popliteal vein. Venous structures cannot be assessed on the current exam given phase of contrast. Recommend correlation with left lower extremity duplex to evaluate for DVT.   ASSESSMENT/PLAN: This is a 84 y.o. male here with left lower extremity swelling and pain for 2 weeks centered around the knee with evidence of stranding in the left lower extremity which could be secondary to DVT although his INR is therapeutic on Coumadin.  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  I have ordered a DVT duplex of his left lower extremity as well as bilateral ABIs.  He does not need any urgent vascular intervention.  He can have an angiogram of his right lower extremity with possible intervention for occlusive tibial disease which could be done as an outpatient given that this is not urgent.  We will wait until we have determined the etiology of his left lower extremity pain and he will also need work-up for left sided lung mass.  Vascular will follow up studies and be available as needed.    Millie Shorb C. Donzetta Matters, MD [facility] Office: [phone] Pager: [phone]

## 2018-01-09 NOTE — Progress Notes (Signed)
The radiologist called stating that they can't get in touch with the admitting provider about the recent x-ray results. Lamar Blinks, MD is notified.

## 2018-01-09 NOTE — Progress Notes (Signed)
VASCULAR LAB PRELIMINARY  ARTERIAL  ABI completed:Right ABI maybe falsely elevated secondary to calcification, however waveforms are tripahsic.  Left ABI is WNL.  Bilateral TBIs are WNL.    RIGHT    LEFT    PRESSURE WAVEFORM  PRESSURE WAVEFORM  BRACHIAL 134 T BRACHIAL 140 T  DP 172 T DP 157 T  AT   AT    PT 197 T PT 175 T  PER   PER    GREAT TOE 0.88 NA GREAT TOE 0.84 NA    RIGHT LEFT  ABI 1.41 1.25     Joann Jorge, RVT 01/09/2018, 12:08 PM

## 2018-01-09 NOTE — H&P (Addendum)
History and Physical    Ernest Sanchez BZJ:[id number] DOB: 1926-05-04 DOA: 01/08/2018  PCP: Leanna Battles, MD Patient coming from: [place] ED  Chief Complaint: Left leg swelling  HPI: Ernest Sanchez is a 84 y.o. male with medical history significant of hypertension, hyperlipidemia, interstitial lung disease presenting to the hospital as a transfer from [place] for evaluation of left leg swelling.  Patient is a poor historian and it was difficult to get a history from him.  Patient states his left calf has been swollen, red, and tight for the past 1.5 weeks.  Denies any history of blood clots.  States he takes Coumadin for history of mitral valve replacement.  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  Patient also mentions that his right foot has been painful and cold for the past 1 year.  States he has not seen a doctor for this problem.   ED Course: Afebrile and hemodynamically stable.  Not tachycardic, not tachypneic, and satting well on room air.  No leukocytosis.  D-dimer 0.84.  Creatinine 1.9.  INR 1.9.   Chest x-ray showing cardiomegaly and vascular congestion.  Also showing fullness of the hila which could reflect vascular shadows or possible lymphadenopathy.  Nonemergent chest CT with contrast recommended.  Showing possible lytic lesion of the right rib versus projectional artifact. ED provider ordered CT angio aortobifemoral which revealed right lower extremity arterial occlusion from the popliteal artery distally.  Mild to moderate atherosclerosis of the proximal right leg vasculature without additional sites of stenosis.  Also showing diffuse aortic and peripheral vessel atherosclerosis without additional site of flow-limiting stenosis or acute arterial findings.  CT also showing left lower lobe pulmonary masslike opacity measuring 4.6 x 3.1 cm suspicious for primary bronchogenic  malignancy.  Nonspecific groundglass opacity in the right upper lobe of the lung.  Multiple hepatic hypodensities which are suspicious for metastatic disease.  Heterogenous stranding and enlargement of the distal posterior left thigh muscles as well as posterior compartment of the calf suspicious for intramuscular hematoma.  Review of Systems: As per HPI otherwise 10 point review of systems negative.  Past Medical History:  Diagnosis Date  . 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 Hyperlipidemia   . Hypertension   . Peripheral neuropathy     History reviewed. No pertinent surgical history.   reports that he has quit smoking. He has never used smokeless tobacco. He reports that he does not drink alcohol or use drugs.  No Known Allergies  History reviewed. No pertinent family history.  Prior to Admission medications   Medication Sig Start Date End Date Taking? Authorizing Provider  albuterol (PROVENTIL HFA;VENTOLIN HFA) 108 (90 BASE) MCG/ACT inhaler Inhale into the lungs every 6 (six) hours as needed for wheezing or shortness of breath.    [provider]  ALBUTEROL SULFATE HFA IN Inhale into the lungs.    [provider]  allopurinol (ZYLOPRIM) 100 MG tablet Take 200 mg by mouth daily.    [provider]  aspirin 81 MG tablet Take 81 mg by mouth daily.    [provider]  citalopram (CELEXA) 40 MG tablet Take 40 mg by mouth daily.    [provider]  dorzolamide (TRUSOPT) 2 % ophthalmic solution 1 drop 2 (two) times daily.    [provider]  furosemide (LASIX) 40 MG tablet Take 40 mg by mouth 2 (two) times daily.  [provider]  gabapentin (NEURONTIN) 300 MG capsule Take 300 mg by mouth 3 (three) times daily.    [provider]  HYDROcodone-acetaminophen (NORCO/VICODIN) 5-325 MG per tablet Take 1-2 tablets by mouth.    [provider]  latanoprost (XALATAN) 0.005 % ophthalmic solution 1 drop at bedtime.    [provider]  levothyroxine (SYNTHROID, LEVOTHROID) 100 MCG tablet Take 100 mcg by mouth daily before breakfast.    [provider]  lisinopril (PRINIVIL,ZESTRIL) 40 MG tablet Take 40 mg by mouth daily.    [provider]  metoprolol (TOPROL-XL) 200 MG 24 hr tablet Take 100 mg by mouth daily.    [provider]  mirtazapine (REMERON) 15 MG tablet Take 15 mg by mouth at bedtime.    [provider]  omeprazole (PRILOSEC) 20 MG capsule Take 20 mg by mouth daily.    [provider]  temazepam (RESTORIL) 7.5 MG capsule Take 7.5 mg by mouth at bedtime as needed for sleep.    [provider]  tiotropium (SPIRIVA HANDIHALER) 18 MCG inhalation capsule Place into inhaler and inhale.    [provider]  warfarin (COUMADIN) 4 MG tablet Take by mouth.    [provider]    Physical Exam: Vitals:   01/08/18 2200 01/08/18 2230 01/08/18 2300 01/09/18 0011  BP: 123/88 112/65 104/61 136/84  Pulse: 74 73 77 94  Resp: 19 16 (!) 21 14  Temp:    98.4 F (36.9 C)  TempSrc:    Oral  SpO2: 96% 99% 96% 95%  Weight:    80.4 kg  Height:        Physical Exam  Constitutional: He is oriented to person, place, and time. He appears well-developed and well-nourished. No distress.  HENT:  Head: Normocephalic.  Mouth/Throat: Oropharynx is clear and moist.  Eyes: Right eye exhibits no discharge. Left eye exhibits no discharge.  Neck: Neck supple.  Cardiovascular: Normal rate, regular rhythm and intact distal pulses.  Pulmonary/Chest: Effort normal and breath sounds normal. No respiratory distress. He has no wheezes. He has no rales.  Abdominal: Soft. Bowel sounds are normal. He exhibits no distension. There is no tenderness.  Musculoskeletal: He exhibits edema.  Left lower extremity: Calf is erythematous and edematous.  Left lower extremity appears larger in size compared to the right lower extremity. Right foot: Cold to touch.  No dorsalis  pedis pulse appreciated.  Neurological: He is alert and oriented to person, place, and time.  Skin: Skin is warm and dry. He is not diaphoretic.     Labs on Admission: I have personally reviewed following labs and imaging studies  CBC: Recent Labs  Lab 01/08/18 1927  WBC 5.2  NEUTROABS 3.7  HGB 11.8*  HCT 38.2*  MCV 101.6*  PLT 229*   Basic Metabolic Panel: Recent Labs  Lab 01/08/18 1927  NA 137  K 4.8  CL 101  CO2 27  GLUCOSE 107*  BUN 60*  CREATININE 1.92*  CALCIUM 10.0   GFR: Estimated Creatinine Clearance: 28.6 mL/min (A) (by C-G formula based on SCr of 1.92 mg/dL (H)). Liver Function Tests: Recent Labs  Lab 01/08/18 1927  AST 31  [name] 17  ALKPHOS 54  BILITOT 1.2  PROT 7.6  ALBUMIN 4.2   No results for input(s): LIPASE, AMYLASE in the last 168 hours. No results for input(s): AMMONIA in the last 168 hours. Coagulation Profile: Recent Labs  Lab 01/08/18 1927  INR 1.93   Cardiac Enzymes:  No results for input(s): CKTOTAL, CKMB, CKMBINDEX, TROPONINI in the last 168 hours. BNP (last 3 results) No results for input(s): PROBNP in the last 8760 hours. HbA1C: No results for input(s): HGBA1C in the last 72 hours. CBG: No results for input(s): GLUCAP in the last 168 hours. Lipid Profile: No results for input(s): CHOL, HDL, LDLCALC, TRIG, CHOLHDL, LDLDIRECT in the last 72 hours. Thyroid Function Tests: No results for input(s): TSH, T4TOTAL, FREET4, T3FREE, THYROIDAB in the last 72 hours. Anemia Panel: No results for input(s): VITAMINB12, FOLATE, FERRITIN, TIBC, IRON, RETICCTPCT in the last 72 hours. Urine analysis: No results found for: COLORURINE, APPEARANCEUR, LABSPEC, PHURINE, GLUCOSEU, HGBUR, BILIRUBINUR, KETONESUR, PROTEINUR, UROBILINOGEN, NITRITE, LEUKOCYTESUR  Radiological Exams on Admission: Dg Chest 2 View  Result Date: 01/08/2018 CLINICAL DATA:  Intermittent LEFT lower extremity swelling with chest pain. History of pneumonia and chronic  interstitial lung disease. EXAM: CHEST - 2 VIEW COMPARISON:  None. FINDINGS: Elevated LEFT hemidiaphragm. Cardiac silhouette is mild-to-moderately enlarged. Pulmonary vascular congestion. Fullness of bilateral hilar with interstitial prominence. No pleural effusion or focal consolidation. LEFT AICD with lead tips projecting RIGHT atrium and bilateral ventricles. Status post CABG. Calcified aortic arch. Biapical pleural thickening. No pneumothorax.Age indeterminate LEFT rib fractures. RIGHT ninth rib lucency. IMPRESSION: Cardiomegaly and vascular congestion. Age indeterminate interstitial prominence. Fullness of the hila could reflect vascular shadows though lymphadenopathy is possible. Recommend non emergent CT chest with contrast. Age indeterminate LEFT rib fractures. Possible lytic lesion RIGHT rib versus projectional artifact. Electronically Signed   By: Elon Alas M.D.   On: 01/08/2018 19:31    EKG: Independently reviewed.  Ventricularly paced rhythm, baseline wander.  No prior EKG for comparison.  Assessment/Plan Active Problems:   Swelling of left lower extremity  Left lower extremity erythema and edema, Right lower extremity arterial occlusion -Afebrile and no leukocytosis.  D-dimer 0.84. ED provider ordered CT angio aortobifemoral which revealed right lower extremity arterial occlusion from the popliteal artery distally.  Mild to moderate atherosclerosis of the proximal right leg vasculature without additional sites of stenosis.  Also showing diffuse aortic and peripheral vessel atherosclerosis without additional site of flow-limiting stenosis or acute arterial findings. Heterogenous stranding and enlargement of the distal posterior left thigh muscles as well as posterior compartment of the calf suspicious for intramuscular hematoma. -Patient is on Coumadin for history of mechanical valve, however, INR subtherapeutic on admission. -Left lower extremity Doppler pending -ABIs pending -I  spoke to Dr. Donzetta Matters from [facility] and IV heparin was initially started due to concern for limb ischemia. Patient was seen by Dr. Donzetta Matters this morning.  His right leg popliteal artery occlusion is thought to be chronic and well compensated.  Recommended bilateral ABIs.  No surgical vascular intervention at this time.  Recommending angiogram of his right lower extremity with possible intervention for occlusive tibial disease which could be done as an outpatient. Will hold IV heparin at this time due to suspicion for calf intramuscular hematoma on CTA.  -Pain management: Tylenol PRN, Norco 5-325 mg 1 to 2 tablets every 6 hours as needed  Suspected malignancy with mets CT angio aortobifemoral showing left lower lobe pulmonary masslike opacity measuring 4.6 x 3.1 cm suspicious for primary bronchogenic malignancy. Multiple hepatic hypodensities which are suspicious for metastatic disease. CXR showing fullness of the hila which could reflect vascular shadows or possible lymphadenopathy. Showing possible lytic lesion of the right rib versus projectional artifact.  CT chest with contrast recommended for further evaluation. -Discuss with oncology in the morning. Patient will  need malignancy workup, possible PET scan.  -Not able to order CT with contrast due to renal insufficiency (Cr 1.9, GFR 30).   Renal insufficiency Creatinine 1.9 and GFR 30.  No recent baseline in the chart. Renal ultrasound negative.  -Continue to monitor renal function.  Patient received IV contrast for CT angio ordered by ED provider. 3 -Will hold off giving IV fluid at this time as chest x-ray with evidence of vascular congestion.  H/o mechanical valve Patient is on Coumadin. INR subtherapeutic at 1.9. -Hold IV heparin due to suspicion for calf intramuscular hematoma on CTA  Chest x-ray with evidence of volume overload Chest x-ray showing cardiomegaly and vascular congestion.  Patient is not hypoxic and breathing comfortably on room air.  No  prior echo in the chart. -Check BNP -Echo  Anemia -Stable.  Hemoglobin 11.8, MCV 101. No recent baseline.  Per care everywhere, hemoglobin 10.6 in June 2018. -Anemia panel -Monitor CBC  DVT prophylaxis: Heparin Code Status: Patient wishes to be full code. Family Communication: No family available. Disposition Plan: Anticipate discharge to home in 1 to 2 days. Consults called: Vascular surgery (Dr. Donzetta Matters) Admission status: Observation   Shela Leff MD [facility] Pager [phone]  If 7PM-7AM, please contact night-coverage [email or web address] Password [email or web address]  01/09/2018, 2:11 AM

## 2018-01-09 NOTE — Discharge Instructions (Signed)

## 2018-01-09 NOTE — Progress Notes (Signed)
ANTICOAGULATION CONSULT NOTE - Initial Consult  Pharmacy Consult for coumadin, now change to heparin for DVT Indication: MVR  No Known Allergies  Patient Measurements: Height: 6' (182.9 cm) Weight: 177 lb 4 oz (80.4 kg) IBW/kg (Calculated) : 77.6   Vital Signs: Temp: 98.6 F (37 C) (12/01 0554) Temp Source: Oral (12/01 0554) BP: 123/69 (12/01 0554) Pulse Rate: 77 (12/01 0554)  Labs: Recent Labs    01/08/18 1927 01/09/18 0701  HGB 11.8*  --   HCT 38.2*  --   PLT 120*  --   LABPROT 21.8*  --   INR 1.93  --   CREATININE 1.92* 1.85*    Estimated Creatinine Clearance: 29.7 mL/min (A) (by C-G formula based on SCr of 1.85 mg/dL (H)).   Medical History: Past Medical History:  Diagnosis Date  . 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 Hyperlipidemia   . Hypertension   . Peripheral neuropathy     Medications:  See medication history  Assessment: 84 yo man admitted with leg pain to continue coumadin.  INR earlier tonight 1.93  His d dimer is 0.84 with concerns for possible DVT. Unsure of coumadin dose or last dose of coumadin.  Positive for DVT so will dc coumadin and start heparin  Heparin started around 0530 and stopped around 1045. Orders now to restart heparin   Goal of Therapy:  Heparin level 0.3-0.7 units/ml Monitor platelets by anticoagulation protocol: Yes   Plan:  Restart Heparin drip at 1400 units/hr  Check heparin level 8 hours after start Daily HL and CBC Monitor for bleeding complications  Thanks for allowing pharmacy to be a part of this patient's care.  Juanell Fairly, PharmD PGY1 Pharmacy Resident Phone [phone] 01/09/2018 1:14 PM

## 2018-01-09 NOTE — Progress Notes (Signed)
Pt alert and oriented x4, no complaints of pain or discomfort.  Bed in low position, call bell within reach.  Bed alarms on and functioning.  Assessment done and charted.  Will continue to monitor and do hourly rounding throughout the shift 

## 2018-01-09 NOTE — Progress Notes (Signed)
ANTICOAGULATION CONSULT NOTE - Initial Consult  Pharmacy Consult for coumadin Indication: MVR  No Known Allergies  Patient Measurements: Height: 6' (182.9 cm) Weight: 177 lb 4 oz (80.4 kg) IBW/kg (Calculated) : 77.6   Vital Signs: Temp: 98.4 F (36.9 C) (12/01 0011) Temp Source: Oral (12/01 0011) BP: 136/84 (12/01 0011) Pulse Rate: 94 (12/01 0011)  Labs: Recent Labs    01/08/18 1927  HGB 11.8*  HCT 38.2*  PLT 120*  LABPROT 21.8*  INR 1.93  CREATININE 1.92*    Estimated Creatinine Clearance: 28.6 mL/min (A) (by C-G formula based on SCr of 1.92 mg/dL (H)).   Medical History: Past Medical History:  Diagnosis Date  . 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 Hyperlipidemia   . Hypertension   . Peripheral neuropathy     Medications:  See medication history  Assessment: 84 yo man admitted with leg pain to continue coumadin.  INR earlier tonight 1.93  His d dimer is 0.84 with concerns for possible DVT. Unsure of coumadin dose or last dose of coumadin.  Goal of Therapy:  INR 2-3, will need to clarify Monitor platelets by anticoagulation protocol: Yes   Plan:  Daily PT/INR.  F/u home coumadin dose and INR goal Lovenox 30 mg sq q24 hours until INR >2.0 Monitor for bleeding complications  Thanks for allowing pharmacy to be a part of this patient's care.  Excell Seltzer, PharmD Clinical Pharmacist  01/09/2018,4:02 AM

## 2018-01-09 NOTE — Progress Notes (Signed)
VASCULAR LAB PRELIMINARY  PRELIMINARY  PRELIMINARY  PRELIMINARY  Left lower extremity venous duplex completed.    Preliminary report:  There is no DVT or SVT noted in the left lower extremity.  Area of fluid noted medial knee, proximal calf, and posterior knee, etiology unknown.  Raun Routh, RVT 01/09/2018, 11:59 AM

## 2018-01-09 NOTE — Discharge Summary (Signed)
Physician Discharge Summary  Ernest Sanchez PZW:[id number] DOB: Dec 11, 1926 DOA: 01/08/2018  PCP: Ernest Battles, MD  Admit date: 01/08/2018 Discharge date: 01/09/2018  Admitted From: home Disposition:  home  Recommendations for Outpatient Follow-up:  1. Follow up with PCP in 1-2 days 2. Follow-up with cardiology in 1 to 2 weeks. 3. Please obtain INR and adjust his warfarin as needed 4. Referral to oncology for new lung mass as soon as possible. 5. Referral to vascular surgery for PAD  Home Health: none Equipment/Devices: Patient has a motor chair at home.  Discharge Condition: Stable CODE STATUS: Full code Diet recommendation: Heart Healthy  HPI: Per Dr. Delray Sanchez is a 84 y.o. male with medical history significant of hypertension, hyperlipidemia, interstitial lung disease presenting to the hospital as a transfer from [facility] for evaluation of left leg swelling.  Patient is a poor historian and it was difficult to get a history from him.  Patient states his left calf has been swollen, red, and tight for the past 1.5 weeks.  Denies any history of blood clots.  States he takes Coumadin for history of mitral valve replacement.  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  Patient also mentions that his right foot has been painful and cold for the past 1 year.  States he has not seen a doctor for this problem.   ED Course: Afebrile and hemodynamically stable.  Not tachycardic, not tachypneic, and satting well on room air.  No leukocytosis.  D-dimer 0.84.  Creatinine 1.9.  INR 1.9.   Chest x-ray showing cardiomegaly and vascular congestion.  Also showing fullness of the hila which could reflect vascular shadows or possible lymphadenopathy.  Nonemergent chest CT with contrast recommended.  Showing possible lytic lesion of the right rib versus projectional artifact. ED provider ordered CT  angio aortobifemoral which revealed right lower extremity arterial occlusion from the popliteal artery distally.  Mild to moderate atherosclerosis of the proximal right leg vasculature without additional sites of stenosis.  Also showing diffuse aortic and peripheral vessel atherosclerosis without additional site of flow-limiting stenosis or acute arterial findings.  CT also showing left lower lobe pulmonary masslike opacity measuring 4.6 x 3.1 cm suspicious for primary bronchogenic malignancy.  Nonspecific groundglass opacity in the right upper lobe of the lung.  Multiple hepatic hypodensities which are suspicious for metastatic disease.  Heterogenous stranding and enlargement of the distal posterior left thigh muscles as well as posterior compartment of the calf suspicious for intramuscular hematoma.  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 fib with pacemaker, COPD not on oxygen and gout who was admitted from [facility] ED for left leg swelling and pain. He gets his care at [facility] in [place].  Patient has left leg swelling and pain for over 2 weeks.  Pain initially started around his knee.  Presented to his PCP and was given a steroid shot about 3 to 4 days ago.  No prior history of DVT or blood clot.  In ED, vital signs not impressive.  D-dimer elevated.  INR subtherapeutic at 1.9.  Chest x-ray with cardiomegaly and some vascular congestion.  CTA aortobifemoral ordered by ED provider and showed RLE arterial occlusion for popliteal artery distally.  Also incidental finding of left lower lobe pulmonary mass measuring 4.6 x 3.1 x 3.0 cm suspicious for primary bronchogenic malignancy and multiple hepatic hypodensities concerning  for metastatic disease.  Patient denies prior diagnosis of cancer.  However, he reports damage to his left lower lung during his mitral valve replacement many years ago.  In regards to his left leg, CTA aortobifemoral showed  heterogeneous stranding within the distal posterior left thigh and proximal left calf. Vascular surgery consulted and ordered vascular ultrasound for lower extremity veins and arteries.  Per vascular surgery, occluded right popliteal artery appears to be chronic and well compensated.  Lower extremity venous ultrasound negative for DVT but showed fluid medial and posterior to left knee and proximal calf which is likely ruptured Baker's cyst.  Patient has no findings to suggest infectious process or cellulitis.  Vascular ultrasound ABI pending at time of discharge.   Patient had an echocardiogram that showed EF of 30 to 35%, diffuse hypokinesis and indeterminate diastolic function, severely dilated left atrium, moderately dilated right atrium, mild to moderate mitral regurg and PAPP of 47.  No prior echo to compare to.  Patient at baseline from cardiopulmonary standpoint.  Ambulating in the room without dyspnea.  Already on diuretics and GDMT.  Patient has cardiologist at [facility].  Vascular surgery, Ernest Sanchez is okay to discharge patient for outpatient follow up through [facility] from vascular standpoint.   Discussed about incidental lung mass and possible liver mets with oncology, Ernest Sanchez who suggested outpatient follow up through [place].  I have discussed the above findings and suggestions with patient, patient's daughter and son-in-law who are comfortable with discharge and follow up with PCP to coordinate referrals through [place].  In regards to his subtherapeutic INR, pharmacy recommended bridging with daily Lovenox  starting here in the hospital.  Daughter to call Coumadin clinic early in the morning for follow-up tomorrow or the day after.  Was given prescription for Lovenox syringes.  Patient was prescribed Voltaren gel for his knee pain and MiraLAX for his chronic constipation.  Discharge Diagnoses:  Principal Problem:   Swelling of left lower extremity Active Problems:   Arterial occlusion, lower extremity  (HCC)  Left leg swelling and pain: Likely ruptured Baker's cyst.  Venous Doppler negative for DVT.  Recently evaluated by PCP and had a steroid injection to left knee.  History of gout. -Evaluated by vascular surgery -Symptomatic management -Discharged on Voltaren gel for knee pain.  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  Venous Doppler negative for DVT -Evaluated by vascular surgery who recommended outpatient follow-up for his PAD  Lung mass and possible liver metastasis: Incidental finding on CTA aortobifemoral runoff. -Outpatient oncology follow-up through [place].  Systolic heart failure: Compensated.  Not in exacerbation.  Already on diuretics and GDMT. -Echocardiogram as above -Follow-up with his cardiologist from [place].  Subtherapeutic INR/mechanical valve: INR 1.9. -Pharmacy recommended Lovenox bridge -Follow-up in Coumadin clinic in 1 to 2 days.  Daughter to call early in the morning.  Elevated creatinine: Unknown baseline.  Likely CKD versus AKI.  Slightly improved here. -BMP at follow-up.  Normocytic anemia: Hemoglobin stable here.  No baseline. -CBC at follow-up  COPD: Stable -Continue home meds.  Chronic constipation: On opiate at home.  Uses Fleet enema at home. -Gave prescription for MiraLAX.   Discharge Instructions  Discharge Instructions    Call MD for:  difficulty breathing, headache or visual disturbances   Complete by:  As directed    Call MD for:  severe uncontrolled pain   Complete by:  As directed    Diet - low sodium heart healthy   Complete by:  As directed  Discharge instructions   Complete by:  As directed    Your leg pain is likely due to ruptured Baker's cyst in your knee.  Your ultrasound did not show blood clot.  We gave you a prescription for Voltaren gel for your knee pain.  Your CAT scan is concerning for lung cancer.  Please follow-up with your primary care doctor as soon as possible.  You Coumadin number is low.  We  discharging you on Lovenox injection in addition to warfarin.  Please follow-up with your Coumadin clinic as soon as possible.  Poor circulation in your legs: Please follow-up with your primary care doctor as soon as possible.  Constipation: We are discharging you on MiraLAX powder.  Please follow the directions  Take care,   Increase activity slowly   Complete by:  As directed      Allergies as of 01/09/2018   No Known Allergies     Medication List    TAKE these medications   ALBUTEROL SULFATE HFA IN Inhale into the lungs.   albuterol 108 (90 Base) MCG/ACT inhaler Commonly known as:  PROVENTIL HFA;VENTOLIN HFA Inhale into the lungs every 6 (six) hours as needed for wheezing or shortness of breath.   allopurinol 100 MG tablet Commonly known as:  ZYLOPRIM Take 200 mg by mouth daily.   aspirin 81 MG tablet Take 81 mg by mouth daily.   citalopram 40 MG tablet Commonly known as:  CELEXA Take 40 mg by mouth daily.   diclofenac sodium 1 % Gel Commonly known as:  VOLTAREN Apply 2 g topically 4 (four) times daily.   dorzolamide 2 % ophthalmic solution Commonly known as:  TRUSOPT 1 drop 2 (two) times daily.   enoxaparin 120 MG/0.8ML injection Commonly known as:  LOVENOX Inject 0.8 mLs (120 mg total) into the skin daily.   furosemide 40 MG tablet Commonly known as:  LASIX Take 40 mg by mouth 2 (two) times daily.   gabapentin 300 MG capsule Commonly known as:  NEURONTIN Take 300 mg by mouth 3 (three) times daily.   HYDROcodone-acetaminophen 5-325 MG tablet Commonly known as:  NORCO/VICODIN Take 1-2 tablets by mouth.   latanoprost 0.005 % ophthalmic solution Commonly known as:  XALATAN 1 drop at bedtime.   levothyroxine 100 MCG tablet Commonly known as:  SYNTHROID, LEVOTHROID Take 100 mcg by mouth daily before breakfast.   lisinopril 40 MG tablet Commonly known as:  PRINIVIL,ZESTRIL Take 40 mg by mouth daily.   metoprolol 200 MG 24 hr tablet Commonly known  as:  TOPROL-XL Take 100 mg by mouth daily.   mirtazapine 15 MG tablet Commonly known as:  REMERON Take 15 mg by mouth at bedtime.   omeprazole 20 MG capsule Commonly known as:  PRILOSEC Take 20 mg by mouth daily.   polyethylene glycol powder powder Commonly known as:  GLYCOLAX/MIRALAX 17 gm one to two times a day as needed for constipation   SPIRIVA HANDIHALER 18 MCG inhalation capsule Generic drug:  tiotropium Place into inhaler and inhale.   temazepam 7.5 MG capsule Commonly known as:  RESTORIL Take 7.5 mg by mouth at bedtime as needed for sleep.   warfarin 4 MG tablet Commonly known as:  COUMADIN Take by mouth.       Consultations:    Procedures/Studies:  2D echo - Left ventricle: The cavity size was normal. Wall thickness was   normal. Systolic function was moderately to severely reduced. The   estimated ejection fraction was in the range of  30% to 35%.   Diffuse hypokinesis with relative periapical akinesis.   Indeterminate diastolic function. - Ventricular septum: Septal motion showed abnormal function and   dyssynergy possibly secondary to RV pacing. - Aortic valve: Moderately calcified annulus. Trileaflet;   moderately calcified leaflets. There was moderate stenosis. There   was mild regurgitation. Mean gradient (S): 13 mm Hg. Peak   gradient (S): 22 mm Hg. VTI ratio of LVOT to aortic valve: 0.3.   Valve area (VTI): 1.23 cm^2. Valve area (Vmax): 1.34 cm^2. Valve   area (Vmean): 1.11 cm^2. - Mitral valve: Severely calcified annulus, possible annular   repair. There was mild to moderate regurgitation. Valve area by   continuity equation (using LVOT flow): 1.45 cm^2. - Left atrium: The atrium was severely dilated. - Right ventricle: The cavity size was mildly dilated. Pacer wire   or catheter noted in right ventricle. Systolic function was   moderately reduced. - Right atrium: The atrium was moderately dilated. Central venous   pressure (est): 8 mm  Hg. - Tricuspid valve: Mildly calcified leaflets. There was mild   regurgitation. - Pulmonary arteries: PA peak pressure: 47 mm Hg (S). - Pericardium, extracardiac: There was no pericardial effusion.   Possible left pleural effusion.   Dg Chest 2 View  Result Date: 01/08/2018 CLINICAL DATA:  Intermittent LEFT lower extremity swelling with chest pain. History of pneumonia and chronic interstitial lung disease. EXAM: CHEST - 2 VIEW COMPARISON:  None. FINDINGS: Elevated LEFT hemidiaphragm. Cardiac silhouette is mild-to-moderately enlarged. Pulmonary vascular congestion. Fullness of bilateral hilar with interstitial prominence. No pleural effusion or focal consolidation. LEFT AICD with lead tips projecting RIGHT atrium and bilateral ventricles. Status post CABG. Calcified aortic arch. Biapical pleural thickening. No pneumothorax.Age indeterminate LEFT rib fractures. RIGHT ninth rib lucency. IMPRESSION: Cardiomegaly and vascular congestion. Age indeterminate interstitial prominence. Fullness of the hila could reflect vascular shadows though lymphadenopathy is possible. Recommend non emergent CT chest with contrast. Age indeterminate LEFT rib fractures. Possible lytic lesion RIGHT rib versus projectional artifact. Electronically Signed   By: Elon Alas M.D.   On: 01/08/2018 19:31   Ct Angio Aortobifemoral W And/or Wo Contrast  Addendum Date: 01/09/2018   ADDENDUM REPORT: 01/09/2018 04:30 ADDENDUM: Findings discussed with Dr Marlowe Sax at 04:20 am Electronically Signed   By: Keith Rake M.D.   On: 01/09/2018 04:30   Addendum Date: 01/09/2018   ADDENDUM REPORT: 01/09/2018 04:05 ADDENDUM: Findings conveyed to patient's nurse Lillia Dallas at 04:04 am Electronically Signed   By: Keith Rake M.D.   On: 01/09/2018 04:05   Result Date: 01/09/2018 CLINICAL DATA:  Left lower extremity edema. Cool right foot and ankle. Nonpalpable right foot pulses. EXAM: CT ANGIOGRAPHY OF ABDOMINAL AORTA WITH  ILIOFEMORAL RUNOFF TECHNIQUE: Multidetector CT imaging of the abdomen, pelvis and lower extremities was performed using the standard protocol during bolus administration of intravenous contrast. Multiplanar CT image reconstructions and MIPs were obtained to evaluate the vascular anatomy. CONTRAST:  64mL ISOVUE-370 IOPAMIDOL (ISOVUE-370) INJECTION 76% COMPARISON:  None. FINDINGS: VASCULAR Aorta: Aortic atherosclerosis without aneurysm, dissection, vasculitis or significant stenosis. Celiac: Patent without evidence of aneurysm, dissection, vasculitis or significant stenosis. Mild plaque at the origin. SMA: Patent without evidence of aneurysm, dissection, vasculitis or significant stenosis. Mild plaque at the origin. Renals: Both renal arteries are patent without evidence of aneurysm, dissection, vasculitis, fibromuscular dysplasia or significant stenosis. Mild plaque at the origin. IMA: Patent. RIGHT Lower Extremity Inflow: Common, internal and external iliac arteries are patent without evidence of aneurysm, dissection,  vasculitis or significant stenosis. Mild to moderate calcified plaque throughout. Outflow: The common, superficial and profunda femoral arteries are patent. There is generalized diminished flow throughout the SFA which remains normal in caliber. No flow in the popliteal artery. Runoff: No visualized flow in the calf arteries. LEFT Lower Extremity Inflow: Common, internal and external iliac arteries are patent without evidence of aneurysm, dissection, vasculitis or significant stenosis. Outflow: Common, superficial and profunda femoral arteries and the popliteal artery are patent without evidence of aneurysm, dissection, vasculitis or significant stenosis. There is perivascular stranding about the popliteal vein. Runoff: Patent three vessel runoff to the ankle. Veins: Venous structures not well assessed given phase of contrast. Scoliotic curvature and multilevel degenerative change throughout the  thoracic spine. Perivascular in intramuscular stranding about the left popliteal vein. Examination not tailored for venous assessment. Review of the MIP images confirms the above findings. NON-VASCULAR Lower chest: Peripheral 4.6 x 3.1 x 3.0 cm spiculated lung mass in the left lower lobe abuts the pleura. Nonspecific ground-glass opacity in the right upper lobe, partially included. Elevated left hemidiaphragm. Pacemaker partially included. Multi chamber cardiomegaly. Calcified bilateral hilar and mediastinal lymph nodes. Hepatobiliary: Multiple small low-density lesions throughout the liver, incompletely assessed on arterial phase exam. Gallbladder physiologically distended, no calcified stone. No biliary dilatation. Pancreas: Parenchymal atrophy. No ductal dilatation or inflammation. Spleen: Normal in size and arterial phase enhancement. Adrenals/Urinary Tract: No adrenal nodule. No hydronephrosis. Minimal symmetric perinephric edema. Urinary bladder is physiologically distended. Stomach/Bowel: Stomach is nondistended. Duodenal diverticulum. No bowel wall thickening, inflammatory change or obstruction. Distal colonic diverticulosis without diverticulitis. Lymphatic: No enlarged abdominopelvic lymph nodes. Reproductive: Prominent prostate gland spans 5.7 cm. Other: No free fluid. Musculoskeletal: There is intramuscular stranding and heterogeneous enlargement about the posterior muscle compartment in the calf, possible intramuscular hematoma. Stranding also involves the posterior thigh muscle compartment, extending along the popliteal vein. Left knee joint effusion. Diffuse subcutaneous edema about the left calf and distal lower extremity. IMPRESSION: VASCULAR 1. Right lower extremity arterial occlusion from the popliteal artery distally. Mild to moderate atherosclerosis of the proximal right leg vasculature without additional sites of stenosis. 2. Diffuse aortic and peripheral vessel atherosclerosis without  additional site of flow-limiting stenosis or acute arterial findings. 3. Intramuscular stranding in the left lower extremity including stranding about the popliteal vein. Venous structures cannot be assessed on the current exam given phase of contrast. Recommend correlation with left lower extremity duplex to evaluate for DVT. NON-VASCULAR 1. Left lower lobe pulmonary masslike opacity measures 4.6 x 3.1 cm, primary bronchogenic malignancy until proven otherwise. 2. Nonspecific ground-glass opacity in the right upper lobe of the lung. 3. Multiple hepatic hypodensities which are suspicious for metastatic disease in the setting of suspected pulmonary malignancy. 4. Heterogeneous stranding and enlargement of the distal posterior left thigh muscles as well as posterior compartment of the calf. Given elevated INR, intramuscular hematoma is favored. I am awaiting a call back from the referring clinician. Electronically Signed: By: Keith Rake M.D. On: 01/09/2018 03:24   US Renal  Result Date: 01/09/2018 CLINICAL DATA:  Initial evaluation for elevated creatinine. EXAM: RENAL / URINARY TRACT ULTRASOUND COMPLETE COMPARISON:  Prior CT from earlier same day. FINDINGS: Right Kidney: Renal measurements: 9.5 x 4.6 x 3.9 cm = volume: 87.1 mL . Echogenicity within normal limits. No mass or hydronephrosis visualized. Left Kidney: Renal measurements: 10.0 x 5.8 x 4.6 cm = volume: 137.9 mL. Echogenicity within normal limits. No mass or hydronephrosis visualized. Bladder: Appears normal for degree of  bladder distention. Bilateral ureteral jets visualized. IMPRESSION: Negative renal ultrasound.  No hydronephrosis. Electronically Signed   By: Jeannine Boga M.D.   On: 01/09/2018 04:37   Vas [place] Lower Extremity Venous (dvt)  Result Date: 01/09/2018  Lower Venous Study Indications: Left leg/knee pain X 2 weeks.  Performing Technologist: Sharion Dove RVS  Examination Guidelines: A complete evaluation includes B-mode  imaging, spectral Doppler, color Doppler, and power Doppler as needed of all accessible portions of each vessel. Bilateral testing is considered an integral part of a complete examination. Limited examinations for reoccurring indications may be performed as noted.  Right Venous Findings: +---+---------------+---------+-----------+----------+-------+    CompressibilityPhasicitySpontaneityPropertiesSummary +---+---------------+---------+-----------+----------+-------+ CFVFull           Yes      Yes                          +---+---------------+---------+-----------+----------+-------+  Left Venous Findings: +---------+---------------+---------+-----------+----------+-------+          CompressibilityPhasicitySpontaneityPropertiesSummary +---------+---------------+---------+-----------+----------+-------+ CFV      Full           Yes      Yes                          +---------+---------------+---------+-----------+----------+-------+ SFJ      Full                                                 +---------+---------------+---------+-----------+----------+-------+ FV Prox  Full                                                 +---------+---------------+---------+-----------+----------+-------+ FV Mid   Full                                                 +---------+---------------+---------+-----------+----------+-------+ FV DistalFull                                                 +---------+---------------+---------+-----------+----------+-------+ PFV      Full                                                 +---------+---------------+---------+-----------+----------+-------+ POP      Full           Yes      Yes                          +---------+---------------+---------+-----------+----------+-------+ PTV      Full                                                 +---------+---------------+---------+-----------+----------+-------+ PERO  Full                                                 +---------+---------------+---------+-----------+----------+-------+ Area of fluid noted medial knee, proximal calf, and posterior knee, etiology unknown.    Summary: Right: No evidence of common femoral vein obstruction. Left: There is no evidence of deep vein thrombosis in the lower extremity.  *See table(s) above for measurements and observations. Electronically signed by Servando Snare MD on 01/09/2018 at 12:01:57 PM.    Final      Discharge Exam: Vitals:   01/09/18 0011 01/09/18 0554  BP: 136/84 123/69  Pulse: 94 77  Resp: 14 15  Temp: 98.4 F (36.9 C) 98.6 F (37 C)  SpO2: 95% 95%   GENERAL: Appears well. No acute distress.  HEENT: MMM.  Vision and Hearing grossly intact.  NECK: Supple.  No JVD.  LUNGS:  No IWOB. Good air movement.  Diminished air movement over left lower lung fields. HEART:  RRR. Heart sounds normal.  ABD: Bowel sounds present. Soft. Non tender.  EXT: Left leg swelling and skin erythema posteriorly.  No increased warmth to touch.  DP pulses not palpable bilaterally.  Ambulates in the room without difficulty. SKIN: no apparent skin lesion.  NEURO: Awake, alert and oriented appropriately.  No gross deficit.  PSYCH: Calm. Normal affect.   The results of significant diagnostics from this hospitalization (including imaging, microbiology, ancillary and laboratory) are listed below for reference.     Microbiology: No results found for this or any previous visit (from the past 240 hour(s)).   Labs: BNP (last 3 results) Recent Labs    01/09/18 0701  BNP 950.9*   Basic Metabolic Panel: Recent Labs  Lab 01/08/18 1927 01/09/18 0701  NA 137 137  K 4.8 4.4  CL 101 102  CO2 27 26  GLUCOSE 107* 108*  BUN 60* 50*  CREATININE 1.92* 1.85*  CALCIUM 10.0 9.2   Liver Function Tests: Recent Labs  Lab 01/08/18 1927  AST 31  Sanchez 17  ALKPHOS 54  BILITOT 1.2  PROT 7.6  ALBUMIN 4.2   No results for  input(s): LIPASE, AMYLASE in the last 168 hours. No results for input(s): AMMONIA in the last 168 hours. CBC: Recent Labs  Lab 01/08/18 1927 01/09/18 1318  WBC 5.2  --   NEUTROABS 3.7  --   HGB 11.8* 11.3*  HCT 38.2* 36.3*  MCV 101.6*  --   PLT 120*  --    Cardiac Enzymes: No results for input(s): CKTOTAL, CKMB, CKMBINDEX, TROPONINI in the last 168 hours. BNP: Invalid input(s): POCBNP CBG: No results for input(s): GLUCAP in the last 168 hours. D-Dimer Recent Labs    01/08/18 1927  DDIMER 0.84*   Hgb A1c No results for input(s): HGBA1C in the last 72 hours. Lipid Profile No results for input(s): CHOL, HDL, LDLCALC, TRIG, CHOLHDL, LDLDIRECT in the last 72 hours. Thyroid function studies No results for input(s): TSH, T4TOTAL, T3FREE, THYROIDAB in the last 72 hours.  Invalid input(s): FREET3 Anemia work up Recent Labs    01/09/18 0701  VITAMINB12 360  FOLATE 15.5  FERRITIN 56  TIBC 379  IRON 80  RETICCTPCT 1.7   Urinalysis No results found for: COLORURINE, APPEARANCEUR, LABSPEC, PHURINE, GLUCOSEU, HGBUR, BILIRUBINUR, KETONESUR, PROTEINUR, UROBILINOGEN, NITRITE, LEUKOCYTESUR Sepsis Labs Invalid input(s): PROCALCITONIN,  WBC,  LACTICIDVEN  Time coordinating discharge: 50 minutes  SIGNED:  Mercy Riding, MD  [facility] 01/09/2018, 3:29 PM Pager [phone]  If 7PM-7AM, please contact night-coverage [email or web address] Password [email or web address]

## 2018-01-15 ENCOUNTER — Encounter (HOSPITAL_BASED_OUTPATIENT_CLINIC_OR_DEPARTMENT_OTHER): Payer: Self-pay | Admitting: *Deleted

## 2018-01-15 ENCOUNTER — Emergency Department (HOSPITAL_BASED_OUTPATIENT_CLINIC_OR_DEPARTMENT_OTHER): Payer: No Typology Code available for payment source

## 2018-01-15 ENCOUNTER — Inpatient Hospital Stay (HOSPITAL_BASED_OUTPATIENT_CLINIC_OR_DEPARTMENT_OTHER)
Admission: EM | Admit: 2018-01-15 | Discharge: 2018-01-22 | DRG: 853 | Disposition: A | Discharge status: Other Acute Inpatient Hospital | Payer: No Typology Code available for payment source | Attending: Internal Medicine | Admitting: Internal Medicine

## 2018-01-15 ENCOUNTER — Other Ambulatory Visit: Payer: Self-pay

## 2018-01-15 DIAGNOSIS — N179 Acute kidney failure, unspecified: Secondary | ICD-10-CM | POA: Diagnosis present

## 2018-01-15 DIAGNOSIS — J849 Interstitial pulmonary disease, unspecified: Secondary | ICD-10-CM | POA: Diagnosis present

## 2018-01-15 DIAGNOSIS — G629 Polyneuropathy, unspecified: Secondary | ICD-10-CM | POA: Diagnosis present

## 2018-01-15 DIAGNOSIS — R6520 Severe sepsis without septic shock: Secondary | ICD-10-CM | POA: Diagnosis not present

## 2018-01-15 DIAGNOSIS — R6521 Severe sepsis with septic shock: Secondary | ICD-10-CM | POA: Diagnosis present

## 2018-01-15 DIAGNOSIS — I5022 Chronic systolic (congestive) heart failure: Secondary | ICD-10-CM | POA: Diagnosis present

## 2018-01-15 DIAGNOSIS — R911 Solitary pulmonary nodule: Secondary | ICD-10-CM | POA: Diagnosis not present

## 2018-01-15 DIAGNOSIS — N183 Chronic kidney disease, stage 3 (moderate): Secondary | ICD-10-CM | POA: Diagnosis present

## 2018-01-15 DIAGNOSIS — R600 Localized edema: Secondary | ICD-10-CM | POA: Diagnosis not present

## 2018-01-15 DIAGNOSIS — L0390 Cellulitis, unspecified: Secondary | ICD-10-CM | POA: Diagnosis present

## 2018-01-15 DIAGNOSIS — I4891 Unspecified atrial fibrillation: Secondary | ICD-10-CM | POA: Diagnosis present

## 2018-01-15 DIAGNOSIS — E861 Hypovolemia: Secondary | ICD-10-CM | POA: Diagnosis present

## 2018-01-15 DIAGNOSIS — D62 Acute posthemorrhagic anemia: Secondary | ICD-10-CM | POA: Diagnosis not present

## 2018-01-15 DIAGNOSIS — Z7901 Long term (current) use of anticoagulants: Secondary | ICD-10-CM

## 2018-01-15 DIAGNOSIS — C787 Secondary malignant neoplasm of liver and intrahepatic bile duct: Secondary | ICD-10-CM | POA: Diagnosis present

## 2018-01-15 DIAGNOSIS — I10 Essential (primary) hypertension: Secondary | ICD-10-CM | POA: Diagnosis not present

## 2018-01-15 DIAGNOSIS — K5909 Other constipation: Secondary | ICD-10-CM | POA: Diagnosis not present

## 2018-01-15 DIAGNOSIS — R339 Retention of urine, unspecified: Secondary | ICD-10-CM | POA: Diagnosis not present

## 2018-01-15 DIAGNOSIS — I130 Hypertensive heart and chronic kidney disease with heart failure and stage 1 through stage 4 chronic kidney disease, or unspecified chronic kidney disease: Secondary | ICD-10-CM | POA: Diagnosis not present

## 2018-01-15 DIAGNOSIS — J60 Coalworker's pneumoconiosis: Secondary | ICD-10-CM | POA: Diagnosis present

## 2018-01-15 DIAGNOSIS — Z7982 Long term (current) use of aspirin: Secondary | ICD-10-CM | POA: Diagnosis not present

## 2018-01-15 DIAGNOSIS — R609 Edema, unspecified: Secondary | ICD-10-CM | POA: Diagnosis not present

## 2018-01-15 DIAGNOSIS — S8012XA Contusion of left lower leg, initial encounter: Secondary | ICD-10-CM | POA: Diagnosis not present

## 2018-01-15 DIAGNOSIS — J439 Emphysema, unspecified: Secondary | ICD-10-CM | POA: Diagnosis present

## 2018-01-15 DIAGNOSIS — I9589 Other hypotension: Secondary | ICD-10-CM | POA: Diagnosis not present

## 2018-01-15 DIAGNOSIS — H9190 Unspecified hearing loss, unspecified ear: Secondary | ICD-10-CM | POA: Diagnosis present

## 2018-01-15 DIAGNOSIS — J449 Chronic obstructive pulmonary disease, unspecified: Secondary | ICD-10-CM | POA: Diagnosis not present

## 2018-01-15 DIAGNOSIS — A419 Sepsis, unspecified organism: Principal | ICD-10-CM

## 2018-01-15 DIAGNOSIS — M25062 Hemarthrosis, left knee: Secondary | ICD-10-CM | POA: Diagnosis present

## 2018-01-15 DIAGNOSIS — I739 Peripheral vascular disease, unspecified: Secondary | ICD-10-CM | POA: Diagnosis not present

## 2018-01-15 DIAGNOSIS — Z87891 Personal history of nicotine dependence: Secondary | ICD-10-CM

## 2018-01-15 DIAGNOSIS — E222 Syndrome of inappropriate secretion of antidiuretic hormone: Secondary | ICD-10-CM | POA: Diagnosis present

## 2018-01-15 DIAGNOSIS — E785 Hyperlipidemia, unspecified: Secondary | ICD-10-CM | POA: Diagnosis present

## 2018-01-15 DIAGNOSIS — L03116 Cellulitis of left lower limb: Secondary | ICD-10-CM | POA: Diagnosis not present

## 2018-01-15 DIAGNOSIS — E039 Hypothyroidism, unspecified: Secondary | ICD-10-CM | POA: Diagnosis present

## 2018-01-15 DIAGNOSIS — M7981 Nontraumatic hematoma of soft tissue: Secondary | ICD-10-CM | POA: Diagnosis present

## 2018-01-15 DIAGNOSIS — Z79899 Other long term (current) drug therapy: Secondary | ICD-10-CM

## 2018-01-15 DIAGNOSIS — I97638 Postprocedural hematoma of a circulatory system organ or structure following other circulatory system procedure: Secondary | ICD-10-CM | POA: Diagnosis not present

## 2018-01-15 DIAGNOSIS — T79A0XA Compartment syndrome, unspecified, initial encounter: Secondary | ICD-10-CM | POA: Diagnosis not present

## 2018-01-15 DIAGNOSIS — Z79890 Hormone replacement therapy (postmenopausal): Secondary | ICD-10-CM

## 2018-01-15 DIAGNOSIS — Z952 Presence of prosthetic heart valve: Secondary | ICD-10-CM

## 2018-01-15 DIAGNOSIS — R918 Other nonspecific abnormal finding of lung field: Secondary | ICD-10-CM | POA: Diagnosis not present

## 2018-01-15 LAB — CBC WITH DIFFERENTIAL/PLATELET
Abs Immature Granulocytes: 0.06 10*3/uL (ref 0.00–0.07)
BASOS ABS: 0 10*3/uL (ref 0.0–0.1)
Basophils Relative: 0 %
Eosinophils Absolute: 0 10*3/uL (ref 0.0–0.5)
Eosinophils Relative: 0 %
HCT: 34 % — ABNORMAL LOW (ref 39.0–52.0)
Hemoglobin: 10.5 g/dL — ABNORMAL LOW (ref 13.0–17.0)
IMMATURE GRANULOCYTES: 1 %
LYMPHS ABS: 0.9 10*3/uL (ref 0.7–4.0)
Lymphocytes Relative: 11 %
MCH: 31.9 pg (ref 26.0–34.0)
MCHC: 30.9 g/dL (ref 30.0–36.0)
MCV: 103.3 fL — ABNORMAL HIGH (ref 80.0–100.0)
Monocytes Absolute: 1.6 10*3/uL — ABNORMAL HIGH (ref 0.1–1.0)
Monocytes Relative: 19 %
Neutro Abs: 6.1 10*3/uL (ref 1.7–7.7)
Neutrophils Relative %: 69 %
Platelets: 147 10*3/uL — ABNORMAL LOW (ref 150–400)
RBC: 3.29 MIL/uL — ABNORMAL LOW (ref 4.22–5.81)
RDW: 13.6 % (ref 11.5–15.5)
WBC: 8.8 10*3/uL (ref 4.0–10.5)
nRBC: 0 % (ref 0.0–0.2)

## 2018-01-15 LAB — URINALYSIS, MICROSCOPIC (REFLEX)

## 2018-01-15 LAB — PROTIME-INR
INR: 1.41
Prothrombin Time: 17.1 seconds — ABNORMAL HIGH (ref 11.4–15.2)

## 2018-01-15 LAB — URINALYSIS, ROUTINE W REFLEX MICROSCOPIC
Bilirubin Urine: NEGATIVE
Glucose, UA: NEGATIVE mg/dL
Ketones, ur: 15 mg/dL — AB
Leukocytes, UA: NEGATIVE
Nitrite: NEGATIVE
PROTEIN: NEGATIVE mg/dL
Specific Gravity, Urine: 1.015 (ref 1.005–1.030)
pH: 5.5 (ref 5.0–8.0)

## 2018-01-15 LAB — I-STAT CG4 LACTIC ACID, ED: Lactic Acid, Venous: 1.48 mmol/L (ref 0.5–1.9)

## 2018-01-15 LAB — COMPREHENSIVE METABOLIC PANEL
ALT: 13 U/L (ref 0–44)
AST: 36 U/L (ref 15–41)
Albumin: 3.8 g/dL (ref 3.5–5.0)
Alkaline Phosphatase: 46 U/L (ref 38–126)
Anion gap: 12 (ref 5–15)
BUN: 69 mg/dL — ABNORMAL HIGH (ref 8–23)
CO2: 23 mmol/L (ref 22–32)
Calcium: 9.1 mg/dL (ref 8.9–10.3)
Chloride: 99 mmol/L (ref 98–111)
Creatinine, Ser: 4.69 mg/dL — ABNORMAL HIGH (ref 0.61–1.24)
GFR calc non Af Amer: 10 mL/min — ABNORMAL LOW (ref >60)
GFR, EST AFRICAN AMERICAN: 12 mL/min — AB (ref >60)
Glucose, Bld: 102 mg/dL — ABNORMAL HIGH (ref 70–99)
Potassium: 4.3 mmol/L (ref 3.5–5.1)
Sodium: 134 mmol/L — ABNORMAL LOW (ref 135–145)
Total Bilirubin: 1.7 mg/dL — ABNORMAL HIGH (ref 0.3–1.2)
Total Protein: 7.6 g/dL (ref 6.5–8.1)

## 2018-01-15 LAB — GLUCOSE, CAPILLARY
Glucose-Capillary: 121 mg/dL — ABNORMAL HIGH (ref 70–99)
Glucose-Capillary: 76 mg/dL (ref 70–99)

## 2018-01-15 LAB — TROPONIN I: Troponin I: 0.09 ng/mL (ref <0.03)

## 2018-01-15 MED ORDER — PHENYLEPHRINE HCL 10 MG/ML IJ SOLN
0.0000 ug/min | INTRAVENOUS | Status: DC
  Filled 2018-01-15: qty 1

## 2018-01-15 MED ORDER — HEPARIN (PORCINE) 25000 UT/250ML-% IV SOLN
1400.0000 [IU]/h | INTRAVENOUS | Status: DC
  Administered 2018-01-15: 1200 [IU]/h via INTRAVENOUS
  Administered 2018-01-16: 1400 [IU]/h via INTRAVENOUS
  Filled 2018-01-15 (×2): qty 250

## 2018-01-15 MED ORDER — PIPERACILLIN-TAZOBACTAM 3.375 G IVPB 30 MIN
3.3750 g | Freq: Once | INTRAVENOUS | Status: AC
  Administered 2018-01-15: 3.375 g via INTRAVENOUS
  Filled 2018-01-15 (×2): qty 50

## 2018-01-15 MED ORDER — PIPERACILLIN-TAZOBACTAM 3.375 G IVPB 30 MIN: 3.3750 g | Freq: Once | INTRAVENOUS | Status: DC

## 2018-01-15 MED ORDER — VANCOMYCIN HCL IN DEXTROSE 750-5 MG/150ML-% IV SOLN: 750.0000 mg | INTRAVENOUS | Status: DC

## 2018-01-15 MED ORDER — PIPERACILLIN-TAZOBACTAM 3.375 G IVPB
INTRAVENOUS | Status: AC
  Filled 2018-01-15: qty 50

## 2018-01-15 MED ORDER — VANCOMYCIN HCL IN DEXTROSE 1-5 GM/200ML-% IV SOLN: 1000.0000 mg | Freq: Once | INTRAVENOUS | Status: DC

## 2018-01-15 MED ORDER — LEVOTHYROXINE SODIUM 100 MCG PO TABS
100.0000 ug | ORAL_TABLET | Freq: Every day | ORAL | Status: DC
  Administered 2018-01-16 – 2018-01-22 (×7): 100 ug via ORAL
  Filled 2018-01-15 (×7): qty 1

## 2018-01-15 MED ORDER — SODIUM CHLORIDE 0.9 % IV BOLUS (SEPSIS)
500.0000 mL | Freq: Once | INTRAVENOUS | Status: AC
  Administered 2018-01-15: 500 mL via INTRAVENOUS

## 2018-01-15 MED ORDER — HYDROCODONE-ACETAMINOPHEN 5-325 MG PO TABS
1.0000 | ORAL_TABLET | Freq: Once | ORAL | Status: AC
  Administered 2018-01-15: 1 via ORAL
  Filled 2018-01-15: qty 1

## 2018-01-15 MED ORDER — SODIUM CHLORIDE 0.9 % IV SOLN
INTRAVENOUS | Status: DC | PRN
  Administered 2018-01-15: 500 mL via INTRAVENOUS
  Administered 2018-01-20: 250 mL via INTRAVENOUS

## 2018-01-15 MED ORDER — SODIUM CHLORIDE 0.9 % IV BOLUS (SEPSIS)
1000.0000 mL | Freq: Once | INTRAVENOUS | Status: AC
  Administered 2018-01-15: 1000 mL via INTRAVENOUS

## 2018-01-15 MED ORDER — PIPERACILLIN-TAZOBACTAM IN DEX 2-0.25 GM/50ML IV SOLN
2.2500 g | Freq: Three times a day (TID) | INTRAVENOUS | Status: DC
  Administered 2018-01-16 – 2018-01-17 (×5): 2.25 g via INTRAVENOUS
  Filled 2018-01-15 (×8): qty 50

## 2018-01-15 MED ORDER — PHENYLEPHRINE HCL-NACL 10-0.9 MG/250ML-% IV SOLN
0.0000 ug/min | INTRAVENOUS | Status: DC
  Administered 2018-01-15: 20 ug/min via INTRAVENOUS
  Administered 2018-01-16: 35 ug/min via INTRAVENOUS
  Administered 2018-01-17: 30 ug/min via INTRAVENOUS
  Filled 2018-01-15 (×3): qty 250

## 2018-01-15 MED ORDER — WARFARIN SODIUM 6 MG PO TABS
6.0000 mg | ORAL_TABLET | Freq: Once | ORAL | Status: AC
  Administered 2018-01-15: 6 mg via ORAL
  Filled 2018-01-15: qty 1

## 2018-01-15 MED ORDER — SODIUM CHLORIDE 0.9 % IV SOLN
INTRAVENOUS | Status: AC
  Administered 2018-01-15: 22:00:00 via INTRAVENOUS

## 2018-01-15 MED ORDER — IPRATROPIUM-ALBUTEROL 0.5-2.5 (3) MG/3ML IN SOLN
3.0000 mL | RESPIRATORY_TRACT | Status: DC | PRN
  Administered 2018-01-17 – 2018-01-21 (×5): 3 mL via RESPIRATORY_TRACT
  Filled 2018-01-15 (×4): qty 3

## 2018-01-15 MED ORDER — SODIUM CHLORIDE 0.9 % IV SOLN
INTRAVENOUS | Status: DC
  Administered 2018-01-16 – 2018-01-21 (×7): via INTRAVENOUS

## 2018-01-15 MED ORDER — VANCOMYCIN HCL IN DEXTROSE 1-5 GM/200ML-% IV SOLN
1000.0000 mg | Freq: Once | INTRAVENOUS | Status: AC
  Administered 2018-01-15: 1000 mg via INTRAVENOUS
  Filled 2018-01-15: qty 200

## 2018-01-15 MED ORDER — WARFARIN - PHARMACIST DOSING INPATIENT: Freq: Every day | Status: DC

## 2018-01-15 NOTE — ED Provider Notes (Signed)
[facility] EMERGENCY DEPARTMENT Provider Note   CSN: [id number] Arrival date & time: 01/15/18  1504     History   Chief Complaint Chief Complaint  Patient presents with  . Leg Swelling    HPI Ernest Sanchez is a 84 y.o. male.  HPI Patient has had general weakness and declining condition since discharge from hospital 12\1.  While hospitalized, there was a lung mass identified suspicious for malignancy with liver lesions suspicious for metastatic lesions.  Patient was initially thought to have right arterial lower extremity occlusion but this was found to be chronic.  Swelling in the left lower extremity had rule out of venous clot by DVT.  Patient's family numbers reports that since his discharge he has been increased redness swelling and pain in the left lower extremity.  He has developed fever and they are concerned for infection.  He has gotten increasingly weak and shown signs of confusion. Past Medical History:  Diagnosis Date  . 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 Hyperlipidemia   . Hypertension   . Peripheral neuropathy     Patient Active Problem List   Diagnosis Date Noted  . 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 Swelling of left lower extremity 01/08/2018    History reviewed. No pertinent surgical history.      Home Medications    Prior to Admission medications   Medication Sig Start Date End Date Taking? Authorizing Provider  albuterol (PROVENTIL HFA;VENTOLIN HFA) 108 (90 BASE) MCG/ACT inhaler Inhale into the lungs every 6 (six) hours as needed for wheezing or shortness of breath.    [provider]  ALBUTEROL SULFATE HFA IN Inhale into the lungs.    [provider]  allopurinol (ZYLOPRIM) 100 MG tablet Take 200 mg by mouth daily.    [provider]  aspirin 81 MG tablet Take 81 mg by mouth daily.    [provider]  citalopram (CELEXA) 40 MG tablet Take 40 mg by mouth daily.    [provider]    diclofenac sodium (VOLTAREN) 1 % GEL Apply 2 g topically 4 (four) times daily. 01/09/18   Mercy Riding, MD  dorzolamide (TRUSOPT) 2 % ophthalmic solution 1 drop 2 (two) times daily.    [provider]  enoxaparin (LOVENOX) 120 MG/0.8ML injection Inject 0.8 mLs (120 mg total) into the skin daily. 01/09/18   Mercy Riding, MD  furosemide (LASIX) 40 MG tablet Take 40 mg by mouth 2 (two) times daily.    [provider]  gabapentin (NEURONTIN) 300 MG capsule Take 300 mg by mouth 3 (three) times daily.    [provider]  HYDROcodone-acetaminophen (NORCO/VICODIN) 5-325 MG per tablet Take 1-2 tablets by mouth.    [provider]  latanoprost (XALATAN) 0.005 % ophthalmic solution 1 drop at bedtime.    [provider]  levothyroxine (SYNTHROID, LEVOTHROID) 100 MCG tablet Take 100 mcg by mouth daily before breakfast.    [provider]  lisinopril (PRINIVIL,ZESTRIL) 40 MG tablet Take 40 mg by mouth daily.    [provider]  metoprolol (TOPROL-XL) 200 MG 24 hr tablet Take 100 mg by mouth daily.    [provider]  mirtazapine (REMERON) 15 MG tablet Take 15 mg by mouth at bedtime.    [provider]  omeprazole (PRILOSEC) 20 MG capsule Take 20 mg by mouth daily.    [provider]  polyethylene glycol powder (GLYCOLAX/MIRALAX) powder 17 gm one to two times a day  as needed for constipation 01/09/18   Wendee Beavers T, MD  temazepam (RESTORIL) 7.5 MG capsule Take 7.5 mg by mouth at bedtime as needed for sleep.    [provider]  tiotropium (SPIRIVA HANDIHALER) 18 MCG inhalation capsule Place into inhaler and inhale.    [provider]  warfarin (COUMADIN) 4 MG tablet Take by mouth.    [provider]    Family History No family history on file.  Social History Social History   Tobacco Use  . Smoking status: Former Research scientist (life sciences)  . Smokeless tobacco: Never Used  Substance Use Topics  . Alcohol  use: Never    Frequency: Never  . Drug use: Never     Allergies   Patient has no known allergies.   Review of Systems Review of Systems 10 Systems reviewed and are negative for acute change except as noted in the HPI.   Physical Exam Updated Vital Signs BP (!) 104/56 (BP Location: Left Arm)   Pulse 75   Temp 98.4 F (36.9 C) (Oral)   Resp 18   Ht 6' (1.829 m)   Wt 80.4 kg   SpO2 95%   BMI 24.04 kg/m   Physical Exam  Constitutional:  Debilitated elderly male.  He is alert without acute respiratory distress.  Very hard of hearing.  HENT:  Head: Normocephalic and atraumatic.  Mucous membranes slightly dry.  Cardiovascular:  Tachycardia.  Distal pulses 1+.  Pulmonary/Chest: Effort normal. No stridor. No respiratory distress. He has no wheezes.  Abdominal: Soft. He exhibits no distension. There is no tenderness.  Musculoskeletal: Normal range of motion.  Indurated area medial lower leg along most of the region of the calf.  Warm and erythematous to touch.  Neurological: He is alert. He exhibits normal muscle tone. Coordination normal.  Skin: Skin is warm and dry. There is pallor.  Psychiatric: He has a normal mood and affect.     ED Treatments / Results  Labs (all labs ordered are listed, but only abnormal results are displayed) Labs Reviewed  COMPREHENSIVE METABOLIC PANEL - Abnormal; Notable for the following components:      Result Value   Sodium 134 (*)    Glucose, Bld 102 (*)    BUN 69 (*)    Creatinine, Ser 4.69 (*)    Total Bilirubin 1.7 (*)    GFR calc non Af Amer 10 (*)    GFR calc Af Amer 12 (*)    All other components within normal limits  CBC WITH DIFFERENTIAL/PLATELET - Abnormal; Notable for the following components:   RBC 3.29 (*)    Hemoglobin 10.5 (*)    HCT 34.0 (*)    MCV 103.3 (*)    Platelets 147 (*)    Monocytes Absolute 1.6 (*)    All other components within normal limits  PROTIME-INR - Abnormal; Notable for the following components:    Prothrombin Time 17.1 (*)    All other components within normal limits  TROPONIN I - Abnormal; Notable for the following components:   Troponin I 0.09 (*)    All other components within normal limits  CULTURE, BLOOD (ROUTINE X 2)  CULTURE, BLOOD (ROUTINE X 2)  URINALYSIS, ROUTINE W REFLEX MICROSCOPIC  I-STAT CG4 LACTIC ACID, ED    EKG EKG Interpretation  Date/Time:  Saturday January 15 2018 15:34:47 EST Ventricular Rate:  78 PR Interval:    QRS Duration: 157 QT Interval:  448 QTC Calculation: 511 R Axis:   -116 Text Interpretation:  Ventricular-paced rhythm No further analysis attempted due to paced rhythm Baseline wander in lead(s) V1 V3 Confirmed by Pryor Curia [phone]) on 01/16/2018 10:07:41 AM   Radiology Dg Chest Port 1 View  Result Date: 01/15/2018 CLINICAL DATA:  Confusion.  Sepsis. EXAM: PORTABLE CHEST 1 VIEW COMPARISON:  01/08/2018 FINDINGS: The heart is enlarged but stable. Stable pacer wires/AICD. Stable surgical changes from mitral valve replacement surgery. Mild tortuosity and calcification of the thoracic aorta. Marked elevation of the left hemidiaphragm with overlying vascular crowding, atelectasis and scarring. Stable biapical pleural and parenchymal scarring. No definite acute superimposed pulmonary process. Healed bilateral rib fractures. IMPRESSION: Stable cardiac enlargement. Chronic emphysematous and bronchitic type lung changes with dense biapical pleural and parenchymal scarring. No definite acute overlying pulmonary process. Electronically Signed   By: Marijo Sanes M.D.   On: 01/15/2018 16:22    Procedures Procedures (including critical care time) CRITICAL CARE Performed by: Charlesetta Shanks   Total critical care time: 45 minutes  Critical care time was exclusive of separately billable procedures and treating other patients.  Critical care was necessary to treat or prevent imminent or life-threatening deterioration.  Critical care was time spent  personally by me on the following activities: development of treatment plan with patient and/or surrogate as well as nursing, discussions with consultants, evaluation of patient's response to treatment, examination of patient, obtaining history from patient or surrogate, ordering and performing treatments and interventions, ordering and review of laboratory studies, ordering and review of radiographic studies, pulse oximetry and re-evaluation of patient's condition. Medications Ordered in ED Medications  sodium chloride 0.9 % bolus 1,000 mL (0 mLs Intravenous Stopped 01/15/18 1732)    And  sodium chloride 0.9 % bolus 1,000 mL (1,000 mLs Intravenous New Bag/Given 01/15/18 1745)    And  sodium chloride 0.9 % bolus 500 mL (has no administration in time range)  0.9 %  sodium chloride infusion (500 mLs Intravenous New Bag/Given 01/15/18 1637)  piperacillin-tazobactam (ZOSYN) IVPB 3.375 g (0 g Intravenous Stopped 01/15/18 1739)  vancomycin (VANCOCIN) IVPB 1000 mg/200 mL premix (1,000 mg Intravenous New Bag/Given 01/15/18 1638)     Initial Impression / Assessment and Plan / ED Course  I have reviewed the triage vital signs and the nursing notes.  Pertinent labs & imaging results that were available during my care of the patient were reviewed by me and considered in my medical decision making (see chart for details).  Clinical Course as of Jan 16 1920  Sat Jan 15, 2018  1901 Consult: Reviewed with Dr. [place] Brick.  Advises that with the low blood pressures and known low EF, wishes to have critical care consulted first.  Recall if critical care feels patient should go to hospital service.   [MP]    Clinical Course User Index [MP] Charlesetta Shanks, MD   Patient presents with advanced medical illnesses including recent diagnosis of cancer but not as of yet identified type.  Patient presents with hypotension and acute renal failure.  Also concern for sepsis with erythematous swollen left lower extremity.  This  had recently been ruled out for DVT.  Fluids and antibiotics initiated in the emergency department.  Patient admitted to intensivist service.  Final Clinical Impressions(s) / ED Diagnoses   Final diagnoses:  Acute renal failure, unspecified acute renal failure type ([place])  Peripheral edema  Cellulitis of left lower extremity    ED Discharge Orders    None       Charlesetta Shanks, MD 01/19/18 [phone]

## 2018-01-15 NOTE — ED Notes (Addendum)
Bladder scan 809 ml

## 2018-01-15 NOTE — ED Notes (Signed)
[facility] notified (Ernest Sanchez) - patient ready for transport 

## 2018-01-15 NOTE — ED Notes (Signed)
RN taking assignment was unable to take report and will call back per secretary on floor.

## 2018-01-15 NOTE — ED Notes (Signed)
Date and time results received: 01/15/18 1620    Test: trp ]Critical Value: 0.09 Name of Provider Notified: Pfeiffer  Orders Received? Or Actions Taken?: no orders given

## 2018-01-15 NOTE — ED Triage Notes (Addendum)
Pt seen here and [facility]. D/c home on 12/1. Family states leg is more swollen, red. States he was difficult to wake this am and has been confused. BP in triage 62/39. Pt awake, but confused as to why he is here in triage. Unable to obtain pulse ox in triage

## 2018-01-15 NOTE — Progress Notes (Signed)
[facility]Link Physician-Brief Progress Note Patient Name: Ernest Sanchez DOB: 1926-06-12 MRN: [id number]   Date of Service  01/15/2018  HPI/Events of Note  84 yo male recently D/C from hospital 12/1. Returns now with hypotension, AKI and red LE. Concern for cellulitis. Has had hypotension with SBP in 60 - 70 range. Now SBP = 90 after 2.5 liters crystalloid. PCCM asked to admit. VSS.   eICU Interventions  No new orders.      Intervention Category Evaluation Type: New Patient Evaluation  Lysle Dingwall 01/15/2018, 10:12 PM

## 2018-01-15 NOTE — Progress Notes (Signed)
ANTICOAGULATION CONSULT NOTE - Initial Consult  Pharmacy Consult for Heparin/Warfarin  Indication: mechanical mitral valve, arterial occlusion   No Known Allergies  Patient Measurements: Height: 6' (182.9 cm) Weight: 175 lb 14.8 oz (79.8 kg) IBW/kg (Calculated) : 77.6  Vital Signs: Temp: 99.3 F (37.4 C) (12/07 2149) Temp Source: Oral (12/07 2149) BP: 99/55 (12/07 2200) Pulse Rate: 74 (12/07 2200)  Labs: Recent Labs    01/15/18 1531  HGB 10.5*  HCT 34.0*  PLT 147*  LABPROT 17.1*  INR 1.41  CREATININE 4.69*  TROPONINI 0.09*    Estimated Creatinine Clearance: 11.7 mL/min (A) (by C-G formula based on SCr of 4.69 mg/dL (H)).   Medical History: Past Medical History:  Diagnosis Date  . 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 Hyperlipidemia   . Hypertension   . Peripheral neuropathy     Assessment: 84 y/o M who was recently discharged from [facility] presents back to the ED with progressive cellulitis. DVT study was negative last admit. CT was positive for an arterial occlusion. Pt is on warfarin PTA for mechanical mitral valve. INR is low at 1.41. Will cover with heparin until INR is therapeutic. Hgb 10.5. Plts 147. Acute on chronic kidney insufficiency.   Goal of Therapy:  Will need to clarify INR goal (?2.5-3.5) Monitor platelets by anticoagulation protocol: Yes   Plan:  -Warfarin 6 mg PO x 1 tonight  -Daily PT/INR -Monitor for bleeding -Start heparin drip at 1200 units/hr -0800 HL -Daily CBC/HL -Continue heparin until INR is therapeutic (will need to clarify INR goal on 12/8)  [name] 01/15/2018,11:05 PM

## 2018-01-15 NOTE — Progress Notes (Signed)
[facility] Progress Note Patient Name: Ernest Sanchez DOB: 18-Sep-1926 MRN: [id number]   Date of Service  01/15/2018  HPI/Events of Note  Patient c/o leg pain - Patient is on Norco at home.   eICU Interventions  Will order: 1. Norco 5 mg / 325 mg  PO now.      Intervention Category Intermediate Interventions: Pain - evaluation and management  Sommer,Steven Eugene 01/15/2018, 10:22 PM

## 2018-01-15 NOTE — ED Notes (Signed)
ED Provider notified of Liter bolus completion

## 2018-01-15 NOTE — H&P (Addendum)
PULMONARY / CRITICAL CARE MEDICINE   NAME:  Ernest Sanchez, MRN:  [id number], DOB:  [birth year], LOS: 0 ADMISSION DATE:  01/15/2018, CONSULTATION DATE: 01/15/2018 REFERRING MD: [facility] emergency room, CHIEF COMPLAINT: Hypotension  BRIEF HISTORY:    84 year old male with hypothyroidism hypertension on chronic anticoagulation for mitral valve coming in with left lower limb swelling redness suggestive of cellulitis and hypotension second sepsis.   HISTORY OF PRESENT ILLNESS   Mr. Ernest Sanchez is an 84 year old male with history of hypertension who was just discharged from the hospital less than a week ago with left leg pain coming in due to feeling weak nauseous and seeing things he went to [facility] where he was found to be hypotensive systolic in the 70J received 2.5 L of IV fluids and started on antibiotics for suspected left lower limb cellulitis and was transferred to our hospital for further management.  Patient was just discharged from the hospital 12/ 1 at that time he was found to have chronic vascular atherosclerosis and occlusion no interventions were done he was also found to have left lower lobe mass with suspected liver lesions highly suggestive of malignancy for further work-up at the [facility].  Patient did not get a chance to follow-up and his condition has deteriorated over the last few days and stated that his pain and redness has been getting worse.  Patient denies any shortness of breath no chest pain no fevers no chills no rigors.  Patient denies any vomiting no diarrhea.   SIGNIFICANT PAST MEDICAL HISTORY   -Hypertension -Hypothyroidism -Interstitial lung disease -Left lung mass -Peripheral vascular disease  SIGNIFICANT EVENTS:    STUDIES:   Ultrasound Doppler>>   CULTURES:  Blood cultures>>  ANTIBIOTICS:  Zosyn>> Vancomycin>>  LINES/TUBES:    CONSULTANTS:    SUBJECTIVE:    CONSTITUTIONAL: BP (!) 99/55   Pulse 74   Temp 99.3 F (37.4 C) (Oral)    Resp (!) 21   Ht 6' (1.829 m)   Wt 79.8 kg   SpO2 98%   BMI 23.86 kg/m   I/O last 3 completed shifts: In: 2250 [IV Piggyback:2250] Out: -         PHYSICAL EXAM: General: Acutely ill Neuro: Awake alert hard of hearing moving all extremities power 5/5 cranial nerves I to XII are intact HEENT: Moist Cardiovascular: Normal heart sound no sounds or murmurs Lungs: Clear equal air sounds bilateral no crackles no wheezing Abdomen: Soft no tenderness no organomegaly Musculoskeletal: Significant swelling and redness of the left leg all the way to the knee tense and tender and warm pulses are palpable in the dorsalis pedis Skin: As above  RESOLVED PROBLEM LIST   ASSESSMENT AND PLAN   Assessment: -Acute left lower limb cellulitis -Severe sepsis -Hypotension second sepsis -Acute kidney injury -Hyponatremia -Anemia -Recent diagnosis of 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 under investigation -Left lower limb chronic ischemia -Mitral valve on chronic anticoagulation -Chronic systolic congestive heart failure ejection fraction 30%  Plan: -Patient received 1/2 L fluid boluses -We will continue IV fluids -Follow urine output and renal function -Follow sodium levels -Hold antihypertensives and diuretics -Zosyn and vancomycin for cellulitis -Follow blood cultures -Sent for CK level -Resume home Coumadin -We will bridge with heparin drip for now since he is subtherapeutic -Ultrasound of the left lower extremity  -Pain control -Patient will need further investigation of his left lower lobe mass which is highly suggestive of malignancy   SUMMARY OF TODAY'S PLAN:  Best Practice / Goals of Care / Disposition.   DVT PROPHYLAXIS: On warfarin SUP: None NUTRITION: Cardiac diet MOBILITY: Bedrest GOALS OF CARE: Full code FAMILY DISCUSSIONS: No family around [place] ICU admission  LABS  Glucose Recent Labs  Lab 01/15/18 2152  GLUCAP 76     BMET Recent Labs  Lab 01/09/18 0701 01/15/18 1531  NA 137 134*  K 4.4 4.3  CL 102 99  CO2 26 23  BUN 50* 69*  CREATININE 1.85* 4.69*  GLUCOSE 108* 102*    Liver Enzymes Recent Labs  Lab 01/15/18 1531  AST 36  [name] 13  ALKPHOS 46  BILITOT 1.7*  ALBUMIN 3.8    Electrolytes Recent Labs  Lab 01/09/18 0701 01/15/18 1531  CALCIUM 9.2 9.1    CBC Recent Labs  Lab 01/09/18 1318 01/15/18 1531  WBC  --  8.8  HGB 11.3* 10.5*  HCT 36.3* 34.0*  PLT  --  147*    ABG No results for input(s): PHART, PCO2ART, PO2ART in the last 168 hours.  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  PAST SURGICAL HISTORY:  He  has no past surgical history on file.  No Known Allergies  No current facility-administered medications on file prior to encounter.    Current Outpatient Medications on File Prior to Encounter  Medication Sig  . albuterol (PROVENTIL HFA;VENTOLIN HFA) 108 (90 BASE) MCG/ACT inhaler Inhale into the lungs every 6 (six) hours as needed for wheezing or shortness of breath.  . ALBUTEROL SULFATE HFA IN Inhale into the lungs.  Marland Kitchen allopurinol (ZYLOPRIM) 100 MG tablet Take 200 mg by mouth daily.  Marland Kitchen aspirin 81 MG tablet Take 81 mg by mouth daily.  . citalopram (CELEXA) 40 MG tablet Take 40 mg by mouth daily.  . diclofenac sodium (VOLTAREN) 1 % GEL Apply 2 g topically 4 (four) times daily.  . dorzolamide (TRUSOPT) 2 % ophthalmic solution 1 drop 2 (two) times daily.  Marland Kitchen enoxaparin (LOVENOX) 120 MG/0.8ML injection Inject 0.8 mLs (120 mg total) into the skin daily.  . furosemide (LASIX) 40 MG tablet Take 40 mg by mouth 2 (two) times daily.  Marland Kitchen gabapentin (NEURONTIN) 300 MG capsule  Take 300 mg by mouth 3 (three) times daily.  Marland Kitchen HYDROcodone-acetaminophen (NORCO/VICODIN) 5-325 MG per tablet Take 1-2 tablets by mouth.  . latanoprost (XALATAN) 0.005 % ophthalmic solution 1 drop at bedtime.  Marland Kitchen levothyroxine (SYNTHROID, LEVOTHROID) 100 MCG tablet Take 100 mcg by mouth daily before breakfast.  . lisinopril (PRINIVIL,ZESTRIL) 40 MG tablet Take 40 mg by mouth daily.  . metoprolol (TOPROL-XL) 200 MG 24 hr tablet Take 100 mg by mouth daily.  . mirtazapine (REMERON) 15 MG tablet Take 15 mg by mouth at bedtime.  Marland Kitchen omeprazole (PRILOSEC) 20 MG capsule Take 20 mg by mouth daily.  . polyethylene glycol powder (GLYCOLAX/MIRALAX) powder 17 gm one to two times a day as needed for constipation  . temazepam (RESTORIL) 7.5 MG capsule Take 7.5 mg by mouth at bedtime as needed for sleep.  Marland Kitchen tiotropium (SPIRIVA HANDIHALER) 18 MCG inhalation capsule Place into inhaler and inhale.  . warfarin (COUMADIN) 4 MG tablet Take by mouth.    FAMILY  HISTORY:   His family history is not on file.  SOCIAL HISTORY:  He  reports that he has quit smoking. He has never used smokeless tobacco. He reports that he does not drink alcohol or use drugs. Patient has daughters and sons in [place]:    All 11 point system review were unremarkable other than what is mentioned history of present illness

## 2018-01-15 NOTE — ED Notes (Signed)
Spoke to [name] in regards to Neo drip. Confirmed drug not in out ED Pyxis. MD Pfieffer notified and orders received.

## 2018-01-15 NOTE — ED Notes (Signed)
Pt attempted to give urine specimen but was not able to urinate at this time

## 2018-01-15 NOTE — ED Notes (Signed)
Troponin 0.09, given to ED MD and RN

## 2018-01-15 NOTE — ED Notes (Signed)
Dr. Johnney Killian made aware of pt's status and VS

## 2018-01-15 NOTE — ED Notes (Signed)
[facility] [place] [phone]

## 2018-01-15 NOTE — Progress Notes (Signed)
Pharmacy Antibiotic Note  Ernest Sanchez is a 84 y.o. male admitted on 01/15/2018 with sepsis and cellulitis.  Pharmacy has been consulted for Vancomycin/Zosyn dosing. WBC WNL. Pt with acute on chronic renal failure (1.85>>>4.69).   Plan:  Vancomycin 750 mg IV q48h -Estimated AUC: 501 Zosyn 2.25g IV q8h Trend WBC, temp, renal function  F/U infectious work-up Drug levels as indicated   Height: 6' (182.9 cm) Weight: 175 lb 14.8 oz (79.8 kg) IBW/kg (Calculated) : 77.6  Temp (24hrs), Avg:99 F (37.2 C), Min:98.4 F (36.9 C), Max:99.8 F (37.7 C)  Recent Labs  Lab 01/09/18 0701 01/15/18 1531 01/15/18 1542  WBC  --  8.8  --   CREATININE 1.85* 4.69*  --   LATICACIDVEN  --   --  1.48    Estimated Creatinine Clearance: 11.7 mL/min (A) (by C-G formula based on SCr of 4.69 mg/dL (H)).    No Known Allergies   Ernest Sanchez 01/15/2018 11:18 PM

## 2018-01-16 ENCOUNTER — Inpatient Hospital Stay (HOSPITAL_COMMUNITY): Payer: No Typology Code available for payment source

## 2018-01-16 DIAGNOSIS — I739 Peripheral vascular disease, unspecified: Secondary | ICD-10-CM

## 2018-01-16 DIAGNOSIS — R918 Other nonspecific abnormal finding of lung field: Secondary | ICD-10-CM

## 2018-01-16 DIAGNOSIS — R609 Edema, unspecified: Secondary | ICD-10-CM

## 2018-01-16 LAB — CBC WITH DIFFERENTIAL/PLATELET
ABS IMMATURE GRANULOCYTES: 0.03 10*3/uL (ref 0.00–0.07)
Basophils Absolute: 0 10*3/uL (ref 0.0–0.1)
Basophils Relative: 0 %
Eosinophils Absolute: 0.1 10*3/uL (ref 0.0–0.5)
Eosinophils Relative: 2 %
HCT: 30.3 % — ABNORMAL LOW (ref 39.0–52.0)
HEMOGLOBIN: 9.2 g/dL — AB (ref 13.0–17.0)
Immature Granulocytes: 1 %
Lymphocytes Relative: 10 %
Lymphs Abs: 0.5 10*3/uL — ABNORMAL LOW (ref 0.7–4.0)
MCH: 31.4 pg (ref 26.0–34.0)
MCHC: 30.4 g/dL (ref 30.0–36.0)
MCV: 103.4 fL — ABNORMAL HIGH (ref 80.0–100.0)
Monocytes Absolute: 0.9 10*3/uL (ref 0.1–1.0)
Monocytes Relative: 17 %
Neutro Abs: 3.5 10*3/uL (ref 1.7–7.7)
Neutrophils Relative %: 70 %
Platelets: 100 10*3/uL — ABNORMAL LOW (ref 150–400)
RBC: 2.93 MIL/uL — ABNORMAL LOW (ref 4.22–5.81)
RDW: 13.5 % (ref 11.5–15.5)
WBC: 5 10*3/uL (ref 4.0–10.5)
nRBC: 0 % (ref 0.0–0.2)

## 2018-01-16 LAB — CK
Total CK: 717 U/L — ABNORMAL HIGH (ref 49–397)
Total CK: 456 U/L — ABNORMAL HIGH (ref 49–397)

## 2018-01-16 LAB — PROTIME-INR
INR: 1.49
Prothrombin Time: 17.9 seconds — ABNORMAL HIGH (ref 11.4–15.2)

## 2018-01-16 LAB — CBC
HCT: 30.5 % — ABNORMAL LOW (ref 39.0–52.0)
Hemoglobin: 9.4 g/dL — ABNORMAL LOW (ref 13.0–17.0)
MCH: 31.4 pg (ref 26.0–34.0)
MCHC: 30.8 g/dL (ref 30.0–36.0)
MCV: 102 fL — ABNORMAL HIGH (ref 80.0–100.0)
PLATELETS: 154 10*3/uL (ref 150–400)
RBC: 2.99 MIL/uL — ABNORMAL LOW (ref 4.22–5.81)
RDW: 13.7 % (ref 11.5–15.5)
WBC: 8.1 10*3/uL (ref 4.0–10.5)
nRBC: 0 % (ref 0.0–0.2)

## 2018-01-16 LAB — BASIC METABOLIC PANEL
Anion gap: 11 (ref 5–15)
BUN: 63 mg/dL — ABNORMAL HIGH (ref 8–23)
CO2: 22 mmol/L (ref 22–32)
Calcium: 8.3 mg/dL — ABNORMAL LOW (ref 8.9–10.3)
Chloride: 102 mmol/L (ref 98–111)
Creatinine, Ser: 3.76 mg/dL — ABNORMAL HIGH (ref 0.61–1.24)
GFR calc Af Amer: 16 mL/min — ABNORMAL LOW (ref >60)
GFR calc non Af Amer: 13 mL/min — ABNORMAL LOW (ref >60)
Glucose, Bld: 115 mg/dL — ABNORMAL HIGH (ref 70–99)
POTASSIUM: 4.2 mmol/L (ref 3.5–5.1)
SODIUM: 135 mmol/L (ref 135–145)

## 2018-01-16 LAB — HEPARIN LEVEL (UNFRACTIONATED)
Heparin Unfractionated: 0.19 IU/mL — ABNORMAL LOW (ref 0.30–0.70)
Heparin Unfractionated: 0.44 IU/mL (ref 0.30–0.70)

## 2018-01-16 LAB — MRSA PCR SCREENING: MRSA BY PCR: NEGATIVE

## 2018-01-16 MED ORDER — ONDANSETRON HCL 4 MG PO TABS
4.0000 mg | ORAL_TABLET | Freq: Three times a day (TID) | ORAL | Status: DC | PRN
  Administered 2018-01-16: 4 mg via ORAL
  Filled 2018-01-16: qty 1

## 2018-01-16 MED ORDER — VANCOMYCIN HCL IN DEXTROSE 1-5 GM/200ML-% IV SOLN
1000.0000 mg | INTRAVENOUS | Status: DC
  Filled 2018-01-16: qty 200

## 2018-01-16 MED ORDER — OXYCODONE HCL 5 MG PO TABS
5.0000 mg | ORAL_TABLET | ORAL | Status: DC | PRN
  Administered 2018-01-16 (×2): 5 mg via ORAL
  Filled 2018-01-16 (×2): qty 1

## 2018-01-16 MED ORDER — HYDROCODONE-ACETAMINOPHEN 5-325 MG PO TABS
2.0000 | ORAL_TABLET | Freq: Four times a day (QID) | ORAL | Status: DC | PRN
  Administered 2018-01-16 – 2018-01-22 (×17): 2 via ORAL
  Filled 2018-01-16 (×17): qty 2

## 2018-01-16 MED ORDER — WARFARIN SODIUM 6 MG PO TABS: 6.0000 mg | ORAL_TABLET | Freq: Once | ORAL | Status: DC

## 2018-01-16 NOTE — Progress Notes (Signed)
Redness has extended compared to when he was admitted last night but is still words marcated.  Patient states that his pain is under better control with pain medicine.  Patient is still on heparin drip.  Patient was evaluated by vascular surgery and recommended to be wrapped and elevated.  They also recommended to keep patient n.p.o. in case his swelling progresses he might need to be taken to the OR.  I will stop his heparin for now to be reevaluated in the morning since his hematoma which might progress with anticoagulation.

## 2018-01-16 NOTE — Progress Notes (Signed)
[facility] Progress Note Patient Name: Ernest Sanchez DOB: [birth year] MRN: [id number]   Date of Service  01/16/2018  HPI/Events of Note  Pain - Patient states that Oxycodone not working. Request Norco which he takes at home.   eICU Interventions  Will order: 1. Norco 5 mg/ 325 mg 2 tabs PO Q 6 hours PRN pain.      Intervention Category Intermediate Interventions: Pain - evaluation and management  Eleanora Guinyard Eugene 01/16/2018, 8:44 PM

## 2018-01-16 NOTE — Progress Notes (Signed)
ANTICOAGULATION CONSULT NOTE - Follow Up Consult  Pharmacy Consult for Heparin/Warfarin  Indication: mechanical mitral valve, arterial occlusion   No Known Allergies  Patient Measurements: Height: 6' (182.9 cm) Weight: 175 lb 14.8 oz (79.8 kg) IBW/kg (Calculated) : 77.6  Vital Signs: Temp: 98.4 F (36.9 C) (12/08 1610) Temp Source: Oral (12/08 1610) BP: 111/59 (12/08 1900) Pulse Rate: 74 (12/08 1900)  Labs: Recent Labs    01/15/18 1531 01/16/18 0051 01/16/18 0750 01/16/18 1909  HGB 10.5* 9.4*  --   --   HCT 34.0* 30.5*  --   --   PLT 147* 154  --   --   LABPROT 17.1* 17.9*  --   --   INR 1.41 1.49  --   --   HEPARINUNFRC  --   --  0.19* 0.44  CREATININE 4.69* 3.76*  --   --   CKTOTAL  --  717*  --  456*  TROPONINI 0.09*  --   --   --     Estimated Creatinine Clearance: 14.6 mL/min (A) (by C-G formula based on SCr of 3.76 mg/dL (H)).   Medical History: Past Medical History:  Diagnosis Date  . 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 Hyperlipidemia   . Hypertension   . Peripheral neuropathy     Assessment: 84 y/o M who was recently discharged from [facility] presents back to the ED with progressive cellulitis. DVT study was negative last admit. CT was positive for an arterial occlusion. Pt is on warfarin PTA for mechanical mitral valve. INR is low at 1.41. Will cover with heparin until INR is therapeutic. Acute on chronic kidney insufficiency noted.   Currently on IV heparin at 1400 units/hr. HL is now therapeutic. Warfarin has been held for possible procedures.   Goal of Therapy:  Will need to clarify INR goal (?8.5-9.2) - family uncertain of goal. Will clarify with PCP tomorrow  Monitor platelets by anticoagulation protocol: Yes   Plan:  -Continue heparin drip at 1400 units/hr -0300 HL  -Daily CBC/HL  Albertina Parr, PharmD., BCPS Clinical Pharmacist Clinical phone for 01/16/18 until 9pm: [phone]

## 2018-01-16 NOTE — Progress Notes (Addendum)
PULMONARY / CRITICAL CARE MEDICINE   NAME:  Ernest Sanchez, MRN:  [id number], DOB:  [birth year], LOS: 1 ADMISSION DATE:  01/15/2018, CONSULTATION DATE: 01/15/2018 REFERRING MD: [facility] emergency room, CHIEF COMPLAINT: Hypotension  BRIEF HISTORY:    84 year old male with hypothyroidism hypertension on chronic anticoagulation for mitral valve coming in with left lower limb swelling redness suggestive of cellulitis and hypotension second sepsis.  HISTORY OF PRESENT ILLNESS   Ernest Sanchez is an 84 year old male with history of hypertension who was just discharged from the hospital less than a week ago with left leg pain coming in due to feeling weak nauseous and seeing things he went to [facility] where he was found to be hypotensive systolic in the 62I received 2.5 L of IV fluids and started on antibiotics for suspected left lower limb cellulitis and was transferred to our hospital for further management.  Patient was just discharged from the hospital 12/ 1 at that time he was found to have chronic vascular atherosclerosis and occlusion no interventions were done he was also found to have left lower lobe mass with suspected liver lesions highly suggestive of malignancy for further work-up at the [facility].  Patient did not get a chance to follow-up and his condition has deteriorated over the last few days and stated that his pain and redness has been getting worse.  Patient denies any shortness of breath no chest pain no fevers no chills no rigors.  Patient denies any vomiting no diarrhea.  SIGNIFICANT PAST MEDICAL HISTORY   -Hypertension -Hypothyroidism -Interstitial lung disease -Left lung mass -Peripheral vascular disease  SIGNIFICANT EVENTS:    STUDIES:   Ultrasound Doppler>>   CULTURES:  Blood cultures>>  ANTIBIOTICS:  Zosyn>> Vancomycin>>  LINES/TUBES:  Peripheral IVs  CONSULTANTS:    SUBJECTIVE:  Patient planing of left lower extremity pain.  Otherwise feels much  better than he did yesterday.  CONSTITUTIONAL: BP (!) 113/52   Pulse 75   Temp 98.6 F (37 C) (Oral)   Resp 13   Ht 6' (1.829 m)   Wt 79.8 kg   SpO2 96%   BMI 23.86 kg/m   I/O last 3 completed shifts: In: 4441.6 [I.V.:1591.6; IV Piggyback:2850.1] Out: 1275 [Urine:1275]        PHYSICAL EXAM: General appearance: 84 y.o., male, NAD, conversant  Eyes: anicteric sclerae, moist conjunctivae; no lid-lag; PERRLA, tracking appropriately HENT: NCAT; oropharynx, MMM, no mucosal ulcerations; normal hard and soft palate Neck: Trachea midline; FROM, supple, lymphadenopathy, no JVD Lungs: Diminished breath sounds bilaterally, no crackles, no wheeze, left base diminished CV: RRR, S1, S2, systolic murmur left lower sternal border Abdomen: Soft, non-tender; non-distended Extremities: Was able to obtain Doppler auscultation of both DP pulses in the lower extremities.  Left extremity is swollen, calf is tense there is redness on the posterior cast and erythema halfway circumferential and extending into the popliteal space and medial posterior thigh.  The right extremity is cool and blue/purple discoloration.  No edema in the right. Skin: Left calf skin described as above Psych: Appropriate affect no evidence of active delirium Neuro: Alert and oriented to person and place, no focal deficit , complains of pain with motion of the left leg.    RESOLVED PROBLEM LIST   ASSESSMENT AND PLAN    Acute left lower limb cellulitis, with concomitant peripheral arterial disease. Severe sepsis, septic shock, hypotension resolved Tracking redness and discoloration up behind the popliteal space into the thigh. Elevated CK 717 -Continue vancomycin and piperacillin tazobactam -  Has had adequate fluid resuscitation no longer requiring vasopressors -We will at least inform vascular surgery the patient is back in the hospital with ongoing issues regarding left lower extremity. -May consider CT imaging of the  left lower extremity soft tissue. -We will have nursing mark boundaries of redness.  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 -Has not had dedicated CT chest for evaluation of the mass or mediastinal adenopathy. - As he is on anticoagulation will likely need to be bridged on heparin for biopsy. -Since he is currently admitted may be candidate for CT-guided biopsy of hepatic lesion to stage and diagnose at same time.  Left lower limb chronic ischemia -Has been seen by vascular surgery on last admission.  Mitral valve on chronic anticoagulation Chronic systolic congestive heart failure ejection fraction 30% -Judicious fluid resuscitation. - No current evidence of decompensation of heart failure. -Would continue heparin drip alone at this time   Best Practice / Goals of Care / Disposition.   DVT PROPHYLAXIS: On heparin ggt  SUP: None NUTRITION: Cardiac diet MOBILITY: Bedrest GOALS OF CARE: Full code FAMILY DISCUSSIONS: Dated family at bedside DISPOSITION ICU admission  LABS  Glucose Recent Labs  Lab 01/15/18 2152 01/15/18 2335  GLUCAP 76 121*    BMET Recent Labs  Lab 01/15/18 1531 01/16/18 0051  NA 134* 135  K 4.3 4.2  CL 99 102  CO2 23 22  BUN 69* 63*  CREATININE 4.69* 3.76*  GLUCOSE 102* 115*    Liver Enzymes Recent Labs  Lab 01/15/18 1531  AST 36  [name] 13  ALKPHOS 46  BILITOT 1.7*  ALBUMIN 3.8    Electrolytes Recent Labs  Lab 01/15/18 1531 01/16/18 0051  CALCIUM 9.1 8.3*    CBC Recent Labs  Lab 01/09/18 1318 01/15/18 1531 01/16/18 0051   WBC  --  8.8 8.1  HGB 11.3* 10.5* 9.4*  HCT 36.3* 34.0* 30.5*  PLT  --  147* 154    ABG No results for input(s): PHART, PCO2ART, PO2ART in the last 168 hours.  Coag's Recent Labs  Lab 01/15/18 1531 01/16/18 0051  INR 1.41 1.49    Sepsis Markers Recent Labs  Lab 01/15/18 1542  LATICACIDVEN 1.48    Cardiac Enzymes Recent Labs  Lab 01/15/18 1531  TROPONINI 0.09*    This patient is critically ill with multiple organ system failure; which, requires frequent high complexity decision making, assessment, support, evaluation, and titration of therapies. This was completed through the application of advanced monitoring technologies and extensive interpretation of multiple databases. During this encounter critical care time was devoted to patient care services described in this note for 36 minutes.  Garner Nash, DO [facility] 01/16/2018 11:10 AM  Personal pager: [phone] If unanswered, please page CCM On-call: [phone]    I spoke with Dr. Trula Slade from vascular surgery who has agreed to see the patient.  CT results with gastro hematoma this was discussed with him.  CCM appreciates vascular surgery input.   Garner Nash, DO [facility] 01/16/2018 7:03 PM

## 2018-01-16 NOTE — Progress Notes (Addendum)
ANTICOAGULATION CONSULT NOTE - Follow Up Consult  Pharmacy Consult for Heparin/Warfarin  Indication: mechanical mitral valve, arterial occlusion   No Known Allergies  Patient Measurements: Height: 6' (182.9 cm) Weight: 175 lb 14.8 oz (79.8 kg) IBW/kg (Calculated) : 77.6  Vital Signs: Temp: 98.6 F (37 C) (12/08 0737) Temp Source: Oral (12/08 0737) BP: 116/66 (12/08 0900) Pulse Rate: 73 (12/08 0900)  Labs: Recent Labs    01/15/18 1531 01/16/18 0051 01/16/18 0750  HGB 10.5* 9.4*  --   HCT 34.0* 30.5*  --   PLT 147* 154  --   LABPROT 17.1* 17.9*  --   INR 1.41 1.49  --   HEPARINUNFRC  --   --  0.19*  CREATININE 4.69* 3.76*  --   CKTOTAL  --  717*  --   TROPONINI 0.09*  --   --     Estimated Creatinine Clearance: 14.6 mL/min (A) (by C-G formula based on SCr of 3.76 mg/dL (H)).   Medical History: Past Medical History:  Diagnosis Date  . 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 Hyperlipidemia   . Hypertension   . Peripheral neuropathy     Assessment: 84 y/o M who was recently discharged from [facility] presents back to the ED with progressive cellulitis. DVT study was negative last admit. CT was positive for an arterial occlusion. Pt is on warfarin PTA for mechanical mitral valve. INR is low at 1.41. Will cover with heparin until INR is therapeutic. Acute on chronic kidney insufficiency noted.   Currently on IV heparin at 1200 units/hr. Initial level is subtherapeutic at 0.19. INR remains subtherapeutic at 1.49. Per RN, mild oozing near peripheral site but no overt s/s of bleeding. Hgb slow trend down. Plt improved   Goal of Therapy:  Will need to clarify INR goal (?6.3-8.4) - family uncertain of goal. Will clarify with PCP tomorrow  Monitor platelets by anticoagulation protocol: Yes   Plan:  -Repeat Warfarin 6 mg PO x 1 tonight  -Daily PT/INR -Monitor for bleeding -Increase heparin drip to 1400 units/hr -1900 HL -Daily CBC/HL -Continue heparin until INR is  therapeutic  Albertina Parr, PharmD., BCPS Clinical Pharmacist Clinical phone for 01/16/18 until 3:30pm: [phone] If after 3:30pm, please refer to AMION for unit-specific pharmacist   Addendum:  Given improvement in renal function, will increase vancomycin to 1 gm IV Q 48 hours.   Albertina Parr, PharmD., BCPS Clinical Pharmacist

## 2018-01-16 NOTE — Consult Note (Signed)
[facility]  Patient name: Ernest Sanchez MRN: [id number] DOB: [birth year] Sex: male   REQUESTING PROVIDER:    CCM   REASON FOR CONSULT:    Left calf hematoma  HISTORY OF PRESENT ILLNESS:   Ernest Sanchez is a 84 y.o. male, who seen by Dr Donzetta Matters 1 week ago for left leg swelling.  At that time the patient had a CT angiogram that revealed popliteal occlusion.  At that time, he had been having pain for approximately 2 weeks.  This pain was centered around the knee it was felt that his vascular disease was well compensated for.  He did not have any evidence of DVT.  He was scheduled for outpatient follow-up.  At that time he was also found to have a left-sided lung mass which was being evaluated.  The patient was readmitted on 12 -08-2017 secondary to nausea and hypotension.  He was found to have a blood pressure in the 70s.  He was resuscitated with IV fluids and given antibiotics for left leg cellulitis.  The patient also has chronic renal insufficiency which was exacerbated by the above.  He is on chronic anticoagulation secondary to his mitral valve.  Today the patient had redness around his popliteal space extending up into his thigh with an elevated CK of 717.  He ended up getting a CT scan of his leg because of the swelling and was found to have a gastrocnemius muscle hematoma.  Patient does complain of pain in this area.  PAST MEDICAL HISTORY    Past Medical History:  Diagnosis Date  . 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 Hyperlipidemia   . Hypertension   . Peripheral neuropathy      FAMILY HISTORY   No family history on file.  SOCIAL HISTORY:   Social History   Socioeconomic History  . Marital status: Legally Separated    Spouse name: Not on file  . Number of children: Not on file  . Years of education: Not on file  . Highest education level: Not on file  Occupational History  . Not on file  Social Needs  . Financial resource  strain: Not on file  . Food insecurity:    Worry: Not on file    Inability: Not on file  . Transportation needs:    Medical: Not on file    Non-medical: Not on file  Tobacco Use  . Smoking status: Former Research scientist (life sciences)  . Smokeless tobacco: Never Used  Substance and Sexual Activity  . Alcohol use: Never    Frequency: Never  . Drug use: Never  . Sexual activity: Not on file  Lifestyle  . Physical activity:    Days per week: Not on file    Minutes per session: Not on file  . Stress: Not on file  Relationships  . Social connections:    Talks on phone: Not on file    Gets together: Not on file    Attends religious service: Not on file    Active member of club or organization: Not on file    Attends meetings of clubs or organizations: Not on file    Relationship status: Not on file  . Intimate partner violence:    Fear of current or ex partner: Not on file    Emotionally abused: Not on file    Physically abused: Not on file    Forced sexual activity: Not on file  Other Topics Concern  . Not on file  Social  History Narrative  . Not on file    ALLERGIES:    No Known Allergies  CURRENT MEDICATIONS:    Current Facility-Administered Medications  Medication Dose Route Frequency Provider Last Rate Last Dose  . 0.9 %  sodium chloride infusion   Intravenous PRN Charlesetta Shanks, MD   Stopped at 01/15/18 1928  . 0.9 %  sodium chloride infusion   Intravenous Continuous Aldean Jewett, MD 125 mL/hr at 01/16/18 1900    . heparin ADULT infusion 100 units/mL (25000 units/273mL sodium chloride 0.45%)  1,400 Units/hr Intravenous Continuous Lavenia Atlas, RPH 14 mL/hr at 01/16/18 1821 1,400 Units/hr at 01/16/18 1821  . ipratropium-albuterol (DUONEB) 0.5-2.5 (3) MG/3ML nebulizer solution 3 mL  3 mL Nebulization Q4H PRN Aljishi, Virgina Norfolk, MD      . levothyroxine (SYNTHROID, LEVOTHROID) tablet 100 mcg  100 mcg Oral Q0600 Aldean Jewett, MD   100 mcg at 01/16/18 0841  . ondansetron (ZOFRAN)  tablet 4 mg  4 mg Oral Q8H PRN Icard, Bradley L, DO   4 mg at 01/16/18 1241  . oxyCODONE (Oxy IR/ROXICODONE) immediate release tablet 5 mg  5 mg Oral Q4H PRN Icard, Bradley L, DO   5 mg at 01/16/18 1656  . phenylephrine (NEOSYNEPHRINE) 10-0.9 MG/250ML-% infusion  0-400 mcg/min Intravenous Titrated Aldean Jewett, MD   Stopped at 01/16/18 [phone]  . piperacillin-tazobactam (ZOSYN) IVPB 2.25 g  2.25 g Intravenous Q8H Erenest Blank, RPH 100 mL/hr at 01/16/18 1616 2.25 g at 01/16/18 1616  . [START ON 01/17/2018] vancomycin (VANCOCIN) IVPB 1000 mg/200 mL premix  1,000 mg Intravenous Q48H Mancheril, Darnell Level, RPH        REVIEW OF SYSTEMS:   [X]  denotes positive finding, [ ]  denotes negative finding Cardiac  Comments:  Chest pain or chest pressure:    Shortness of breath upon exertion:    Short of breath when lying flat:    Irregular heart rhythm:        Vascular    Pain in calf, thigh, or hip brought on by ambulation:    Pain in feet at night that wakes you up from your sleep:     Blood clot in your veins:    Leg swelling:  x       Pulmonary    Oxygen at home:    Productive cough:     Wheezing:         Neurologic    Sudden weakness in arms or legs:     Sudden numbness in arms or legs:     Sudden onset of difficulty speaking or slurred speech:    Temporary loss of vision in one eye:     Problems with dizziness:         Gastrointestinal    Blood in stool:      Vomited blood:         Genitourinary    Burning when urinating:     Blood in urine:        Psychiatric    Major depression:         Hematologic    Bleeding problems: x   Problems with blood clotting too easily:        Skin    Rashes or ulcers:        Constitutional    Fever or chills:     PHYSICAL EXAM:   Vitals:   01/16/18 1610 01/16/18 1700 01/16/18 1800 01/16/18 1900  BP:  114/75 114/62 (!) 111/59  Pulse:  73 (!) 106 74  Resp:  20 20 18   Temp: 98.4 F (36.9 C)     TempSrc: Oral     SpO2:  98% 96%  100%  Weight:      Height:        GENERAL: The patient is a well-nourished male, in no acute distress. The vital signs are documented above. CARDIAC: There is a regular rate and rhythm.  VASCULAR: left calf is very full and tender with erythema.  I do no t feel there is a compartment syndrome currently.  Doppler signals at ankle PULMONARY: Nonlabored respirations ABDOMEN: Soft and non-tender with normal pitched bowel sounds.  MUSCULOSKELETAL: There are no major deformities or cyanosis. NEUROLOGIC: No focal weakness or paresthesias are detected. SKIN: There are no ulcers or rashes noted. PSYCHIATRIC: The patient has a normal affect.  STUDIES:   I have reviewed the following: Venous duplex: There is no DVT or SVT noted in the left lower extremity.  No change since study done 01/09/18. There is significant hematoma noted from mid calf to popliteal fossa.  Left leg CT: 1. Generalized soft tissue edema in the subcutaneous fat circumferentially around the left lower leg as can be seen with cellulitis versus venous insufficiency. 2. 7.3 x 4.2 x 17 cm intramuscular hematoma in the medial gastrocnemius muscle. 3. No soft tissue emphysema to suggest necrotizing infection. 4. Small hemarthrosis.   ASSESSMENT and PLAN   Left calf hematoma:  The patient has a large left gastrok hematoma which is causing him to have significant pain.  The blood flow to the foot is not compromised.  He does not have skin compromise or compartment syndrome.  I am concerned that if this progressed, he would need to goto the OR for exploration and evacuation.  He is on anticoagulation which is likely contributing. Also, with the cellulitis, this could get infected.  He is currently eating dinner.  I will make him NPO after midnight and we can re-asses him in the AM.  He is to keep his leg elevated and have an ACE wrap placed for compression.   Annamarie Major, MD [facility]  [phone] Pager [phone]

## 2018-01-16 NOTE — Progress Notes (Signed)
VASCULAR LAB PRELIMINARY  PRELIMINARY  PRELIMINARY  PRELIMINARY  Left lower extremity venous duplex completed.    Preliminary report:  There is no DVT or SVT noted in the left lower extremity.  No change since study done 01/09/18. There is significant hematoma noted from mid calf to popliteal fossa.    Kaliya Shreiner, RVT 01/16/2018, 7:06 PM

## 2018-01-17 ENCOUNTER — Inpatient Hospital Stay (HOSPITAL_COMMUNITY): Payer: No Typology Code available for payment source | Admitting: Certified Registered"

## 2018-01-17 ENCOUNTER — Encounter (HOSPITAL_COMMUNITY): Payer: Self-pay

## 2018-01-17 ENCOUNTER — Encounter (HOSPITAL_COMMUNITY)
Admission: EM | Disposition: A | Discharge status: Other Acute Inpatient Hospital | Payer: Self-pay | Source: Home / Self Care | Attending: Internal Medicine

## 2018-01-17 DIAGNOSIS — S8012XA Contusion of left lower leg, initial encounter: Secondary | ICD-10-CM

## 2018-01-17 HISTORY — PX: HEMATOMA EVACUATION: SHX5118

## 2018-01-17 HISTORY — PX: FASCIOTOMY: SHX132

## 2018-01-17 LAB — CBC
HCT: 26.8 % — ABNORMAL LOW (ref 39.0–52.0)
HCT: 24.6 % — ABNORMAL LOW (ref 39.0–52.0)
Hemoglobin: 8.2 g/dL — ABNORMAL LOW (ref 13.0–17.0)
Hemoglobin: 7.5 g/dL — ABNORMAL LOW (ref 13.0–17.0)
MCH: 31.9 pg (ref 26.0–34.0)
MCH: 32.3 pg (ref 26.0–34.0)
MCHC: 30.6 g/dL (ref 30.0–36.0)
MCHC: 30.5 g/dL (ref 30.0–36.0)
MCV: 104.3 fL — ABNORMAL HIGH (ref 80.0–100.0)
MCV: 106 fL — ABNORMAL HIGH (ref 80.0–100.0)
Platelets: 102 10*3/uL — ABNORMAL LOW (ref 150–400)
Platelets: 99 10*3/uL — ABNORMAL LOW (ref 150–400)
RBC: 2.57 MIL/uL — ABNORMAL LOW (ref 4.22–5.81)
RBC: 2.32 MIL/uL — ABNORMAL LOW (ref 4.22–5.81)
RDW: 13.4 % (ref 11.5–15.5)
RDW: 13.5 % (ref 11.5–15.5)
WBC: 4 10*3/uL (ref 4.0–10.5)
WBC: 3.9 10*3/uL — ABNORMAL LOW (ref 4.0–10.5)
nRBC: 0 % (ref 0.0–0.2)
nRBC: 0 % (ref 0.0–0.2)

## 2018-01-17 LAB — BASIC METABOLIC PANEL
Anion gap: 8 (ref 5–15)
BUN: 47 mg/dL — ABNORMAL HIGH (ref 8–23)
CO2: 21 mmol/L — ABNORMAL LOW (ref 22–32)
Calcium: 8.7 mg/dL — ABNORMAL LOW (ref 8.9–10.3)
Chloride: 110 mmol/L (ref 98–111)
Creatinine, Ser: 2.12 mg/dL — ABNORMAL HIGH (ref 0.61–1.24)
GFR calc Af Amer: 31 mL/min — ABNORMAL LOW (ref >60)
GFR, EST NON AFRICAN AMERICAN: 27 mL/min — AB (ref >60)
GLUCOSE: 106 mg/dL — AB (ref 70–99)
Potassium: 4.8 mmol/L (ref 3.5–5.1)
Sodium: 139 mmol/L (ref 135–145)

## 2018-01-17 LAB — PROTIME-INR
INR: 1.58
Prothrombin Time: 18.6 seconds — ABNORMAL HIGH (ref 11.4–15.2)

## 2018-01-17 LAB — CK: Total CK: 276 U/L (ref 49–397)

## 2018-01-17 LAB — HEPARIN LEVEL (UNFRACTIONATED): Heparin Unfractionated: <0.1 IU/mL — ABNORMAL LOW (ref 0.30–0.70)

## 2018-01-17 SURGERY — FASCIOTOMY, UPPER EXTREMITY
Anesthesia: General | Site: Leg Lower | Laterality: Left

## 2018-01-17 MED ORDER — LIDOCAINE 2% (20 MG/ML) 5 ML SYRINGE
INTRAMUSCULAR | Status: AC
  Filled 2018-01-17: qty 5

## 2018-01-17 MED ORDER — MEPERIDINE HCL 50 MG/ML IJ SOLN: 6.2500 mg | INTRAMUSCULAR | Status: DC | PRN

## 2018-01-17 MED ORDER — PROPOFOL 10 MG/ML IV BOLUS
INTRAVENOUS | Status: AC
  Filled 2018-01-17: qty 20

## 2018-01-17 MED ORDER — ACETAMINOPHEN 325 MG PO TABS: 325.0000 mg | ORAL_TABLET | ORAL | Status: DC | PRN

## 2018-01-17 MED ORDER — SODIUM CHLORIDE 0.9 % IV SOLN
INTRAVENOUS | Status: DC | PRN
  Administered 2018-01-17: 50 ug/min via INTRAVENOUS

## 2018-01-17 MED ORDER — 0.9 % SODIUM CHLORIDE (POUR BTL) OPTIME
TOPICAL | Status: DC | PRN
  Administered 2018-01-17: 1000 mL

## 2018-01-17 MED ORDER — FENTANYL CITRATE (PF) 250 MCG/5ML IJ SOLN
INTRAMUSCULAR | Status: AC
  Filled 2018-01-17: qty 5

## 2018-01-17 MED ORDER — OXYCODONE HCL 5 MG PO TABS: 5.0000 mg | ORAL_TABLET | Freq: Once | ORAL | Status: DC | PRN

## 2018-01-17 MED ORDER — ONDANSETRON HCL 4 MG/2ML IJ SOLN: 4.0000 mg | Freq: Once | INTRAMUSCULAR | Status: DC | PRN

## 2018-01-17 MED ORDER — FENTANYL CITRATE (PF) 100 MCG/2ML IJ SOLN
INTRAMUSCULAR | Status: DC | PRN
  Administered 2018-01-17 (×2): 25 ug via INTRAVENOUS

## 2018-01-17 MED ORDER — ONDANSETRON HCL 4 MG/2ML IJ SOLN
INTRAMUSCULAR | Status: DC | PRN
  Administered 2018-01-17: 4 mg via INTRAVENOUS

## 2018-01-17 MED ORDER — OXYCODONE HCL 5 MG/5ML PO SOLN: 5.0000 mg | Freq: Once | ORAL | Status: DC | PRN

## 2018-01-17 MED ORDER — MORPHINE SULFATE (PF) 2 MG/ML IV SOLN
2.0000 mg | INTRAVENOUS | Status: DC | PRN
  Administered 2018-01-19 – 2018-01-22 (×8): 2 mg via INTRAVENOUS
  Filled 2018-01-17 (×8): qty 1

## 2018-01-17 MED ORDER — IPRATROPIUM-ALBUTEROL 0.5-2.5 (3) MG/3ML IN SOLN
RESPIRATORY_TRACT | Status: AC
  Filled 2018-01-17: qty 3

## 2018-01-17 MED ORDER — ROCURONIUM BROMIDE 10 MG/ML (PF) SYRINGE
PREFILLED_SYRINGE | INTRAVENOUS | Status: DC | PRN
  Administered 2018-01-17: 20 mg via INTRAVENOUS

## 2018-01-17 MED ORDER — FENTANYL CITRATE (PF) 100 MCG/2ML IJ SOLN
INTRAMUSCULAR | Status: AC
  Filled 2018-01-17: qty 2

## 2018-01-17 MED ORDER — LIDOCAINE 2% (20 MG/ML) 5 ML SYRINGE
INTRAMUSCULAR | Status: DC | PRN
  Administered 2018-01-17: 80 mg via INTRAVENOUS

## 2018-01-17 MED ORDER — ACETAMINOPHEN 160 MG/5ML PO SOLN: 325.0000 mg | ORAL | Status: DC | PRN

## 2018-01-17 MED ORDER — PIPERACILLIN-TAZOBACTAM IN DEX 2-0.25 GM/50ML IV SOLN
2.2500 g | INTRAVENOUS | Status: AC
  Administered 2018-01-17: 2.25 g via INTRAVENOUS
  Filled 2018-01-17: qty 50

## 2018-01-17 MED ORDER — LACTATED RINGERS IV SOLN
INTRAVENOUS | Status: DC
  Administered 2018-01-17: 13:00:00 via INTRAVENOUS

## 2018-01-17 MED ORDER — PIPERACILLIN-TAZOBACTAM 3.375 G IVPB
3.3750 g | Freq: Three times a day (TID) | INTRAVENOUS | Status: DC
  Administered 2018-01-17 – 2018-01-21 (×12): 3.375 g via INTRAVENOUS
  Filled 2018-01-17 (×13): qty 50

## 2018-01-17 MED ORDER — HYDROCODONE-ACETAMINOPHEN 5-325 MG PO TABS
ORAL_TABLET | ORAL | Status: AC
  Filled 2018-01-17: qty 2

## 2018-01-17 MED ORDER — PHENYLEPHRINE 40 MCG/ML (10ML) SYRINGE FOR IV PUSH (FOR BLOOD PRESSURE SUPPORT)
PREFILLED_SYRINGE | INTRAVENOUS | Status: DC | PRN
  Administered 2018-01-17: 160 ug via INTRAVENOUS

## 2018-01-17 MED ORDER — SUGAMMADEX SODIUM 200 MG/2ML IV SOLN
INTRAVENOUS | Status: DC | PRN
  Administered 2018-01-17: 160 mg via INTRAVENOUS

## 2018-01-17 MED ORDER — FENTANYL CITRATE (PF) 100 MCG/2ML IJ SOLN
25.0000 ug | INTRAMUSCULAR | Status: DC | PRN
  Administered 2018-01-17: 50 ug via INTRAVENOUS

## 2018-01-17 MED ORDER — FENTANYL CITRATE (PF) 100 MCG/2ML IJ SOLN: 25.0000 ug | INTRAMUSCULAR | Status: DC | PRN

## 2018-01-17 MED ORDER — ONDANSETRON HCL 4 MG/2ML IJ SOLN
INTRAMUSCULAR | Status: AC
  Filled 2018-01-17: qty 2

## 2018-01-17 MED ORDER — SODIUM CHLORIDE 0.9 % IV BOLUS
500.0000 mL | Freq: Once | INTRAVENOUS | Status: AC
  Administered 2018-01-17: 500 mL via INTRAVENOUS

## 2018-01-17 MED ORDER — ROCURONIUM BROMIDE 50 MG/5ML IV SOSY
PREFILLED_SYRINGE | INTRAVENOUS | Status: AC
  Filled 2018-01-17: qty 5

## 2018-01-17 MED ORDER — SUCCINYLCHOLINE CHLORIDE 200 MG/10ML IV SOSY
PREFILLED_SYRINGE | INTRAVENOUS | Status: DC | PRN
  Administered 2018-01-17: 160 mg via INTRAVENOUS

## 2018-01-17 MED ORDER — LACTATED RINGERS IV SOLN
INTRAVENOUS | Status: DC | PRN
  Administered 2018-01-17: 14:00:00 via INTRAVENOUS

## 2018-01-17 MED ORDER — PROPOFOL 10 MG/ML IV BOLUS
INTRAVENOUS | Status: DC | PRN
  Administered 2018-01-17: 20 mg via INTRAVENOUS
  Administered 2018-01-17: 80 mg via INTRAVENOUS

## 2018-01-17 SURGICAL SUPPLY — 29 items
BANDAGE ACE 4X5 VEL STRL LF (GAUZE/BANDAGES/DRESSINGS) IMPLANT
BANDAGE ACE 6X5 VEL STRL LF (GAUZE/BANDAGES/DRESSINGS) IMPLANT
BNDG GAUZE ELAST 4 BULKY (GAUZE/BANDAGES/DRESSINGS) IMPLANT
CANISTER SUCT 3000ML PPV (MISCELLANEOUS) ×2 IMPLANT
CANISTER WOUNDNEG PRESSURE 500 (CANNISTER) ×1 IMPLANT
CONNECTOR Y ATS VAC SYSTEM (MISCELLANEOUS) ×1 IMPLANT
COVER WAND RF STERILE (DRAPES) ×1 IMPLANT
DRSG VAC ATS MED SENSATRAC (GAUZE/BANDAGES/DRESSINGS) ×1 IMPLANT
ELECT REM PT RETURN 9FT ADLT (ELECTROSURGICAL) ×2
ELECTRODE REM PT RTRN 9FT ADLT (ELECTROSURGICAL) ×1 IMPLANT
GAUZE SPONGE 4X4 12PLY STRL (GAUZE/BANDAGES/DRESSINGS) ×1 IMPLANT
GLOVE BIO SURGEON STRL SZ7.5 (GLOVE) ×2 IMPLANT
GOWN STRL REUS W/ TWL LRG LVL3 (GOWN DISPOSABLE) ×3 IMPLANT
GOWN STRL REUS W/TWL LRG LVL3 (GOWN DISPOSABLE) ×3
KIT BASIN OR (CUSTOM PROCEDURE TRAY) ×2 IMPLANT
KIT TURNOVER KIT B (KITS) ×2 IMPLANT
NS IRRIG 1000ML POUR BTL (IV SOLUTION) ×2 IMPLANT
PACK GENERAL/GYN (CUSTOM PROCEDURE TRAY) ×1 IMPLANT
PACK UNIVERSAL I (CUSTOM PROCEDURE TRAY) ×1 IMPLANT
PAD ARMBOARD 7.5X6 YLW CONV (MISCELLANEOUS) ×4 IMPLANT
PAD NEG PRESSURE SENSATRAC (MISCELLANEOUS) ×1 IMPLANT
STAPLER VISISTAT 35W (STAPLE) IMPLANT
SUT ETHILON 3 0 PS 1 (SUTURE) IMPLANT
SUT VIC AB 2-0 CTX 36 (SUTURE) IMPLANT
SUT VIC AB 3-0 SH 27 (SUTURE)
SUT VIC AB 3-0 SH 27X BRD (SUTURE) IMPLANT
SUT VICRYL 4-0 PS2 18IN ABS (SUTURE) IMPLANT
TOWEL GREEN STERILE (TOWEL DISPOSABLE) ×3 IMPLANT
WATER STERILE IRR 1000ML POUR (IV SOLUTION) ×1 IMPLANT

## 2018-01-17 NOTE — Interval H&P Note (Signed)
History and Physical Interval Note:  01/17/2018 2:12 PM  Ernest Sanchez  has presented today for surgery, with the diagnosis of FASCIOTOMY  The various methods of treatment have been discussed with the patient and family. After consideration of risks, benefits and other options for treatment, the patient has consented to  Procedure(s): LOWER EXTREMITY 4 COMPARTMENT FASCIOTOMY (Left) as a surgical intervention .  The patient's history has been reviewed, patient examined, no change in status, stable for surgery.  I have reviewed the patient's chart and labs.  Questions were answered to the patient's satisfaction.     Ruta Hinds

## 2018-01-17 NOTE — Anesthesia Preprocedure Evaluation (Addendum)
Anesthesia Evaluation  Patient identified by MRN, date of birth, ID band Patient awake    Reviewed: Allergy & Precautions, H&P , NPO status , Patient's Chart, lab work & pertinent test results, reviewed documented beta blocker date and time   Airway Mallampati: II  TM Distance: >3 FB Neck ROM: full    Dental no notable dental hx.    Pulmonary COPD,  COPD inhaler, former smoker,    Pulmonary exam normal breath sounds clear to auscultation       Cardiovascular Exercise Tolerance: Good hypertension, Pt. on medications + pacemaker  Rhythm:regular Rate:Normal  Chronic systolic congestive heart failure ejection fraction 30%   Neuro/Psych negative neurological ROS  negative psych ROS   GI/Hepatic negative GI ROS, Neg liver ROS,   Endo/Other  Hypothyroidism   Renal/GU ARFRenal disease  negative genitourinary   Musculoskeletal   Abdominal   Peds  Hematology  (+) Blood dyscrasia, anemia ,   Anesthesia Other Findings   Reproductive/Obstetrics negative OB ROS                             Anesthesia Physical Anesthesia Plan  ASA: IV and emergent  Anesthesia Plan: General   Post-op Pain Management:    Induction: Intravenous  PONV Risk Score and Plan:   Airway Management Planned: Oral ETT  Additional Equipment:   Intra-op Plan:   Post-operative Plan: Extubation in OR  Informed Consent: I have reviewed the patients History and Physical, chart, labs and discussed the procedure including the risks, benefits and alternatives for the proposed anesthesia with the patient or authorized representative who has indicated his/her understanding and acceptance.   Dental Advisory Given  Plan Discussed with: CRNA  Anesthesia Plan Comments: ( ECHO 12/19 - Left ventricle: The cavity size was normal. Wall thickness was   normal. Systolic function was moderately to severely reduced. The   estimated  ejection fraction was in the range of 30% to 35%.   Diffuse hypokinesis with relative periapical akinesis.   Indeterminate diastolic function. - Ventricular septum: Septal motion showed abnormal function and   dyssynergy possibly secondary to RV pacing. - Aortic valve: Moderately calcified annulus. Trileaflet;   moderately calcified leaflets. There was moderate stenosis. There   was mild regurgitation. Mean gradient (S): 13 mm Hg. Peak   gradient (S): 22 mm Hg. VTI ratio of LVOT to aortic valve: 0.3.   Valve area (VTI): 1.23 cm^2. Valve area (Vmax): 1.34 cm^2. Valve   area (Vmean): 1.11 cm^2. - Mitral valve: Severely calcified annulus, possible annular   repair. There was mild to moderate regurgitation. Valve area by   continuity equation (using LVOT flow): 1.45 cm^2. - Left atrium: The atrium was severely dilated. - Right ventricle: The cavity size was mildly dilated. Pacer wire   or catheter noted in right ventricle. Systolic function was   moderately reduced.  )       Anesthesia Quick Evaluation

## 2018-01-17 NOTE — Transfer of Care (Signed)
Immediate Anesthesia Transfer of Care Note  Patient: Ernest Sanchez  Procedure(s) Performed: LEFT LOWER EXTREMITY 4 COMPARTMENT FASCIOTOMY (Left Leg Lower) EVACUATION HEMATOMA LEFT LOWER LEG (Left Leg Lower)  Patient Location: PACU  Anesthesia Type:General  Level of Consciousness: awake, alert  and patient cooperative  Airway & Oxygen Therapy: Patient Spontanous Breathing and Patient connected to nasal cannula oxygen  Post-op Assessment: Report given to RN, Post -op Vital signs reviewed and stable and Patient moving all extremities  Post vital signs: Reviewed and stable  Last Vitals:  Vitals Value Taken Time  BP 142/58 01/17/2018  3:27 PM  Temp    Pulse 76 01/17/2018  3:30 PM  Resp 16 01/17/2018  3:30 PM  SpO2 97 % 01/17/2018  3:30 PM  Vitals shown include unvalidated device data.  Last Pain:  Vitals:   01/17/18 1152  TempSrc: Oral  PainSc:          Complications: No apparent anesthesia complications

## 2018-01-17 NOTE — Progress Notes (Signed)
Patient transferred to [place] room 19 from PACU. CCMD verified x2, vital signs stable, wound vac at 100 mmHg continuous with small serosanguinous drainage from left lower leg anterior wound. Will continue to monitor and provide care.

## 2018-01-17 NOTE — Progress Notes (Signed)
Report received from [place], [place] 3MW.

## 2018-01-17 NOTE — Anesthesia Postprocedure Evaluation (Signed)
Anesthesia Post Note  Patient: Mearle Drew  Procedure(s) Performed: LEFT LOWER EXTREMITY 4 COMPARTMENT FASCIOTOMY (Left Leg Lower) EVACUATION HEMATOMA LEFT LOWER LEG (Left Leg Lower)     Patient location during evaluation: PACU Anesthesia Type: General Level of consciousness: awake and alert Pain management: pain level controlled Vital Signs Assessment: post-procedure vital signs reviewed and stable Respiratory status: spontaneous breathing, nonlabored ventilation, respiratory function stable and patient connected to nasal cannula oxygen Cardiovascular status: blood pressure returned to baseline and stable Postop Assessment: no apparent nausea or vomiting Anesthetic complications: no    Last Vitals:  Vitals:   01/17/18 1527 01/17/18 1545  BP: (!) 142/58   Pulse: 77 83  Resp: (!) 22 19  Temp: (!) 36.3 C   SpO2: 98% (!) 86%    Last Pain:  Vitals:   01/17/18 1527  TempSrc:   PainSc: 0-No pain                 Ilya Neely

## 2018-01-17 NOTE — Progress Notes (Signed)
PULMONARY / CRITICAL CARE MEDICINE   NAME:  Ernest Sanchez, MRN:  [id number], DOB:  [birth year], LOS: 2 ADMISSION DATE:  01/15/2018, CONSULTATION DATE: 01/15/2018 REFERRING MD: [facility] emergency room, CHIEF COMPLAINT: Hypotension  BRIEF HISTORY:    84 year old male with hypothyroidism hypertension on chronic anticoagulation for mitral valve coming in with left lower limb swelling redness suggestive of cellulitis and hypotension second sepsis. Deemed to have hematoma  SIGNIFICANT EVENTS:  12/7 admit for LLE swelling  12/8 found to have L gastrocnemius hematoma. Vascular surgery consulted.  12/9 patient and family have opted for surgery  STUDIES:   Ultrasound Doppler>> no evidence DVT CT LLE 12/8 > Generalized soft tissue edema in the subcutaneous fat circumferentially around the left lower leg as can be seen with cellulitis versus venous insufficiency. 7.3 x 4.2 x 17 cm intramuscular hematoma in the medial gastrocnemius muscle. No soft tissue emphysema to suggest necrotizing infection. Small hemarthrosis. CT chest 12/8 > 4.4 cm posterior left lower lobe mass, compatible primary bronchogenic neoplasm. Small mediastinal nodes, suspicious for nodal metastases. Hilar regions are suboptimally evaluated in the absence of intravenous Contrast. Associated small left pleural effusion, possibly malignant. 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  CULTURES:  Blood cultures>>  ANTIBIOTICS:  Zosyn>> 12/7 >>> Vancomycin>>12/7 >12/9  LINES/TUBES:  Peripheral IVs  CONSULTANTS:  Vascular Surgery  SUBJECTIVE:  LLE pain, otherwise no complaints. Being taken for surgery in a few minutes.   CONSTITUTIONAL: BP 132/65   Pulse 76   Temp 98.5 F (36.9 C) (Oral)   Resp 18   Ht 6' (1.829 m)   Wt 79.8 kg   SpO2 98%   BMI 23.86 kg/m   I/O last 3 completed shifts: In: 5570.8 [I.V.:4805.3; IV Piggyback:765.5] Out: 2900 [Urine:2900]     PHYSICAL EXAM:  General  appearance: elderly male in NAD. Hard of hearing. R ear better. HEENT: [place]/AT, PERRL, no JVD Lungs: clear bilateral breath sounds CV: RRR, S1, S2, systolic murmur left lower sternal border Abdomen: Soft, non-tender; non-distended Extremities: LLE wrapped.  Skin: Left calf skin described as above Psych: Appropriate affect no evidence of active delirium Neuro: Alert and oriented to person and place, no focal deficit , complains of pain with motion of the left leg.   RESOLVED PROBLEM LIST  Possible Severe sepsis, septic shock, hypotension resolved  ASSESSMENT AND PLAN    Left gastrocnemius hematoma - vascular surgery following, plans for OR and evacuation   Acute left lower limb cellulitis (possible), with concomitant peripheral arterial disease. - Continue ABX for 7 day course. Low threshold to DC - DC vancomycin  Acute kidney injury: Elevated CK 717 likely secondary to sepsis - Somewhat improved based on yesterday BMP, will repeat today.   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 CT chest 12/8 confirms. - Rather complex situation with high risk patient. - As he is on anticoagulation will likely need to be bridged on heparin for biopsy. -Since he is currently admitted may be candidate for CT-guided biopsy of hepatic lesion to stage and diagnose at same time. Effusion very small, but may be malignant. Perhaps IR could perform diagnostic thora. Would be difficult.   Left lower limb chronic ischemia - vascular following  Mitral valve on chronic anticoagulation Chronic systolic congestive heart failure ejection fraction 30%- No current evidence of decompensation of heart failure -Judicious fluid resuscitation - Anticoagulation on hold. Unclear why he  is on this in the first place. Reports of mechanical mitral valve on admission, however, his  physician office lists reason as AF. Will need to be clarified prior to restarting.   Transfer to telemetry.    Best Practice / Goals of Care / Disposition.   DVT PROPHYLAXIS: On heparin ggt  SUP: None NUTRITION: Cardiac diet MOBILITY: Bedrest GOALS OF CARE: Full code FAMILY DISCUSSIONS: Dated family at bedside DISPOSITION ICU admission  LABS  Glucose Recent Labs  Lab 01/15/18 2152 01/15/18 2335  GLUCAP 76 121*    BMET Recent Labs  Lab 01/15/18 1531 01/16/18 0051  NA 134* 135  K 4.3 4.2  CL 99 102  CO2 23 22  BUN 69* 63*  CREATININE 4.69* 3.76*  GLUCOSE 102* 115*    Liver Enzymes Recent Labs  Lab 01/15/18 1531  AST 36  [name] 13  ALKPHOS 46  BILITOT 1.7*  ALBUMIN 3.8    Electrolytes Recent Labs  Lab 01/15/18 1531 01/16/18 0051  CALCIUM 9.1 8.3*    CBC Recent Labs  Lab 01/16/18 0051 01/16/18 1937 01/17/18 0406  WBC 8.1 5.0 4.0  HGB 9.4* 9.2* 8.2*  HCT 30.5* 30.3* 26.8*  PLT 154 100* 102*    ABG No results for input(s): PHART, PCO2ART, PO2ART in the last 168 hours.  Coag's Recent Labs  Lab 01/15/18 1531 01/16/18 0051 01/17/18 0406  INR 1.41 1.49 1.58    Sepsis Markers Recent Labs  Lab 01/15/18 1542  LATICACIDVEN 1.48    Cardiac Enzymes Recent Labs  Lab 01/15/18 1531  TROPONINI 0.09*    My critical care time excluding procedures: 30 minutes   Georgann Housekeeper, AGACNP-BC [place] Pager [phone] or [phone]  01/17/2018 12:19 PM

## 2018-01-17 NOTE — Progress Notes (Signed)
  Progress Note    01/17/2018 11:03 AM * No surgery found *  Subjective:  Having pain in left leg   Vitals:   01/17/18 0900 01/17/18 1000  BP: (!) 91/49 (!) 95/52  Pulse: 74 75  Resp: (!) 21 20  Temp:    SpO2: 98% 97%    Physical Exam: aaox3 Non labored respirations Abdomen is soft Left leg is tight with surrounding erythematous changes  CBC    Component Value Date/Time   WBC 4.0 01/17/2018 0406   RBC 2.57 (L) 01/17/2018 0406   HGB 8.2 (L) 01/17/2018 0406   HGB 13.2 04/10/2009 1412   HCT 26.8 (L) 01/17/2018 0406   HCT 41.3 04/10/2009 1412   PLT 102 (L) 01/17/2018 0406   PLT 149 04/10/2009 1412   MCV 104.3 (H) 01/17/2018 0406   MCV 88.4 04/10/2009 1412   MCH 31.9 01/17/2018 0406   MCHC 30.6 01/17/2018 0406   RDW 13.4 01/17/2018 0406   RDW 16.1 (H) 04/10/2009 1412   LYMPHSABS 0.5 (L) 01/16/2018 1937   LYMPHSABS 1.0 04/10/2009 1412   MONOABS 0.9 01/16/2018 1937   MONOABS 0.8 04/10/2009 1412   EOSABS 0.1 01/16/2018 1937   EOSABS 0.2 04/10/2009 1412   BASOSABS 0.0 01/16/2018 1937   BASOSABS 0.0 04/10/2009 1412    BMET    Component Value Date/Time   NA 135 01/16/2018 0051   K 4.2 01/16/2018 0051   CL 102 01/16/2018 0051   CO2 22 01/16/2018 0051   GLUCOSE 115 (H) 01/16/2018 0051   BUN 63 (H) 01/16/2018 0051   CREATININE 3.76 (H) 01/16/2018 0051   CALCIUM 8.3 (L) 01/16/2018 0051   GFRNONAA 13 (L) 01/16/2018 0051   GFRAA 16 (L) 01/16/2018 0051    INR    Component Value Date/Time   INR 1.58 01/17/2018 0406     Intake/Output Summary (Last 24 hours) at 01/17/2018 1103 Last data filed at 01/17/2018 1000 Gross per 24 hour  Intake 3176.92 ml  Output 1895 ml  Net 1281.92 ml     Assessment/plan:  84 y.o. male is here left lower extremity gastrocnemius hematoma that is worse from a week prior.  He has severe pain with passive motion although sensation and motor are intact.  I discussed with the family and we will tentatively plan for hematoma evacuation  possible 4 compartment fasciotomies left lower extremity  Brandon C. Donzetta Matters, MD [facility] [place] Office: [phone] Pager: [phone]  01/17/2018 11:03 AM

## 2018-01-17 NOTE — Anesthesia Procedure Notes (Signed)
Procedure Name: Intubation Date/Time: 01/17/2018 2:28 PM Performed by: Leonor Liv, CRNA Pre-anesthesia Checklist: Patient identified, Emergency Drugs available, Suction available and Patient being monitored Patient Re-evaluated:Patient Re-evaluated prior to induction Oxygen Delivery Method: Circle System Utilized Preoxygenation: Pre-oxygenation with 100% oxygen Induction Type: IV induction Ventilation: Mask ventilation without difficulty Laryngoscope Size: Mac and 4 Grade View: Grade I Tube type: Oral Tube size: 7.5 mm Number of attempts: 1 Airway Equipment and Method: Stylet and Oral airway Placement Confirmation: ETT inserted through vocal cords under direct vision,  positive ETCO2 and breath sounds checked- equal and bilateral Secured at: 23 cm Tube secured with: Tape Dental Injury: Teeth and Oropharynx as per pre-operative assessment

## 2018-01-17 NOTE — Progress Notes (Signed)
Pharmacy Antibiotic Note  Ernest Sanchez is a 84 y.o. male admitted on 01/15/2018 with sepsis and cellulitis.  Pharmacy has been consulted for Zosyn dosing. WBC WNL. Pt with acute on chronic renal and SCr down to 2.12 w/ CrCl ~ 25. -cultures ngtd, WBC= 4, tmax= 100.7  Plan:  -Adjust zosyn to 3.375gm IV q8h -Will follow renal function, cultures and clinical progress     Height: 6' (182.9 cm) Weight: 175 lb 14.8 oz (79.8 kg) IBW/kg (Calculated) : 77.6  Temp (24hrs), Avg:98.8 F (37.1 C), Min:97.4 F (36.3 C), Max:100.7 F (38.2 C)  Recent Labs  Lab 01/15/18 1531 01/15/18 1542 01/16/18 0051 01/16/18 1937 01/17/18 0406 01/17/18 1231  WBC 8.8  --  8.1 5.0 4.0  --   CREATININE 4.69*  --  3.76*  --   --  2.12*  LATICACIDVEN  --  1.48  --   --   --   --     Estimated Creatinine Clearance: 25.9 mL/min (A) (by C-G formula based on SCr of 2.12 mg/dL (H)).    No Known Allergies    Hildred Laser, PharmD Clinical Pharmacist **Pharmacist phone directory can now be found on [email or web address] ([email or web address]).  Listed under [place].   e

## 2018-01-17 NOTE — Op Note (Signed)
Procedure: Incision and drainage of left gastrocnemius hematoma, 4 compartment fasciotomy left leg, application of VAC dressing  Preoperative diagnosis: Left leg hematoma  Postoperative diagnosis: Same  Anesthesia: General  Assistant: Nurse  Operative findings: #1 large hematoma left gastrocnemius muscle approximately 10 x 5 cm old blood no active bleeding  2.  Pale appearing poorly contractile anterior lateral compartment with easily contractile posterior professional and posterior deep compartment  Operative details: After obtaining informed consent, the patient taken the operating.  The patient was placed in supine position operating table.  After induction of general anesthesia patient's entire left lower extremity was prepped and draped in usual sterile fashion.  Next a longitudinal incision was made on the medial aspect of the left leg.  Incision was good on through subtenons tissues down level the fascia.  The fascia was opened into the gastrocnemius muscle.  There was a large 10 x 5 cm hematoma with old appearing blood.  All this was evacuated and thoroughly irrigated normal saline solution.  There is no obvious active bleeding.  Next I made an additional incision just anterior to this to open up the posterior deep compartment.  Solea's muscle in the muscles of the deep compartment all appeared viable and pink and contractile with cautery.  Gastrocnemius muscle although stained with blood did also appear to be viable with cautery contractility.  At this point I made a incision on the lateral aspect over the anterior compartment.  Incision was carried on through subtenons tissues down level of fascia.  Incision was made over the anterior tibialis muscle for the full length the incision.  This muscle was more pale in appearance and less contractile.  I then opened the lateral compartment.  There was also similar findings here with less contractile more pale muscle.  There was no active bleeding  in either of these compartments.  There then was thoroughly irrigated with normal saline solution.  A VAC dressing was then applied to both wounds.  Pressure was set at 125 continuous.  Patient tired procedure well and there were no complications.  The instrument sponge needle count was correct in the case.  The patient was taken to recovery room in stable condition.  Ruta Hinds, MD [facility] [place] Office: [phone] Pager: [phone]

## 2018-01-17 NOTE — H&P (View-Only) (Signed)
  Progress Note    01/17/2018 11:03 AM * No surgery found *  Subjective:  Having pain in left leg   Vitals:   01/17/18 0900 01/17/18 1000  BP: (!) 91/49 (!) 95/52  Pulse: 74 75  Resp: (!) 21 20  Temp:    SpO2: 98% 97%    Physical Exam: aaox3 Non labored respirations Abdomen is soft Left leg is tight with surrounding erythematous changes  CBC    Component Value Date/Time   WBC 4.0 01/17/2018 0406   RBC 2.57 (L) 01/17/2018 0406   HGB 8.2 (L) 01/17/2018 0406   HGB 13.2 04/10/2009 1412   HCT 26.8 (L) 01/17/2018 0406   HCT 41.3 04/10/2009 1412   PLT 102 (L) 01/17/2018 0406   PLT 149 04/10/2009 1412   MCV 104.3 (H) 01/17/2018 0406   MCV 88.4 04/10/2009 1412   MCH 31.9 01/17/2018 0406   MCHC 30.6 01/17/2018 0406   RDW 13.4 01/17/2018 0406   RDW 16.1 (H) 04/10/2009 1412   LYMPHSABS 0.5 (L) 01/16/2018 1937   LYMPHSABS 1.0 04/10/2009 1412   MONOABS 0.9 01/16/2018 1937   MONOABS 0.8 04/10/2009 1412   EOSABS 0.1 01/16/2018 1937   EOSABS 0.2 04/10/2009 1412   BASOSABS 0.0 01/16/2018 1937   BASOSABS 0.0 04/10/2009 1412    BMET    Component Value Date/Time   NA 135 01/16/2018 0051   K 4.2 01/16/2018 0051   CL 102 01/16/2018 0051   CO2 22 01/16/2018 0051   GLUCOSE 115 (H) 01/16/2018 0051   BUN 63 (H) 01/16/2018 0051   CREATININE 3.76 (H) 01/16/2018 0051   CALCIUM 8.3 (L) 01/16/2018 0051   GFRNONAA 13 (L) 01/16/2018 0051   GFRAA 16 (L) 01/16/2018 0051    INR    Component Value Date/Time   INR 1.58 01/17/2018 0406     Intake/Output Summary (Last 24 hours) at 01/17/2018 1103 Last data filed at 01/17/2018 1000 Gross per 24 hour  Intake 3176.92 ml  Output 1895 ml  Net 1281.92 ml     Assessment/plan:  84 y.o. male is here left lower extremity gastrocnemius hematoma that is worse from a week prior.  He has severe pain with passive motion although sensation and motor are intact.  I discussed with the family and we will tentatively plan for hematoma evacuation  possible 4 compartment fasciotomies left lower extremity  Layman Gully C. Donzetta Matters, MD [facility] [place] Office: [phone] Pager: [phone]  01/17/2018 11:03 AM

## 2018-01-17 NOTE — Progress Notes (Signed)
Received new orders from Dr. Donzetta Matters for wet to dry dressing and to wrap with an ACE bandage if the wound vac does not maintain suction. Notified him of patient's BP 87/61 patient asymptomatic, received and entered new orders for stat CBC, 500 Normal Saline IV bolus of normal saline, and CBC lab orders for 01/18/18.

## 2018-01-17 NOTE — Plan of Care (Signed)

## 2018-01-18 ENCOUNTER — Encounter (HOSPITAL_COMMUNITY): Payer: Self-pay | Admitting: Vascular Surgery

## 2018-01-18 LAB — PROTIME-INR
INR: 1.83
Prothrombin Time: 21 seconds — ABNORMAL HIGH (ref 11.4–15.2)

## 2018-01-18 LAB — CBC
HEMATOCRIT: 23.7 % — AB (ref 39.0–52.0)
Hemoglobin: 7.2 g/dL — ABNORMAL LOW (ref 13.0–17.0)
MCH: 31.7 pg (ref 26.0–34.0)
MCHC: 30.4 g/dL (ref 30.0–36.0)
MCV: 104.4 fL — ABNORMAL HIGH (ref 80.0–100.0)
Platelets: 103 10*3/uL — ABNORMAL LOW (ref 150–400)
RBC: 2.27 MIL/uL — ABNORMAL LOW (ref 4.22–5.81)
RDW: 13.5 % (ref 11.5–15.5)
WBC: 3.2 10*3/uL — ABNORMAL LOW (ref 4.0–10.5)
nRBC: 0 % (ref 0.0–0.2)

## 2018-01-18 LAB — ABO/RH: ABO/RH(D): O POS

## 2018-01-18 LAB — BASIC METABOLIC PANEL
Anion gap: 9 (ref 5–15)
BUN: 44 mg/dL — ABNORMAL HIGH (ref 8–23)
CHLORIDE: 111 mmol/L (ref 98–111)
CO2: 20 mmol/L — ABNORMAL LOW (ref 22–32)
Calcium: 8.6 mg/dL — ABNORMAL LOW (ref 8.9–10.3)
Creatinine, Ser: 1.93 mg/dL — ABNORMAL HIGH (ref 0.61–1.24)
GFR calc Af Amer: 35 mL/min — ABNORMAL LOW (ref >60)
GFR calc non Af Amer: 30 mL/min — ABNORMAL LOW (ref >60)
Glucose, Bld: 131 mg/dL — ABNORMAL HIGH (ref 70–99)
Potassium: 5.1 mmol/L (ref 3.5–5.1)
Sodium: 140 mmol/L (ref 135–145)

## 2018-01-18 LAB — HEMOGLOBIN AND HEMATOCRIT, BLOOD
HCT: 27 % — ABNORMAL LOW (ref 39.0–52.0)
Hemoglobin: 8 g/dL — ABNORMAL LOW (ref 13.0–17.0)

## 2018-01-18 LAB — PREPARE RBC (CROSSMATCH)

## 2018-01-18 MED ORDER — LATANOPROST 0.005 % OP SOLN
1.0000 [drp] | Freq: Every day | OPHTHALMIC | Status: DC
  Administered 2018-01-18 – 2018-01-21 (×4): 1 [drp] via OPHTHALMIC
  Filled 2018-01-18: qty 2.5

## 2018-01-18 MED ORDER — DORZOLAMIDE HCL 2 % OP SOLN
1.0000 [drp] | Freq: Two times a day (BID) | OPHTHALMIC | Status: DC
  Administered 2018-01-18 – 2018-01-22 (×9): 1 [drp] via OPHTHALMIC
  Filled 2018-01-18: qty 10

## 2018-01-18 MED ORDER — SODIUM CHLORIDE 0.9% IV SOLUTION
Freq: Once | INTRAVENOUS | Status: AC
  Administered 2018-01-18: 16:00:00 via INTRAVENOUS

## 2018-01-18 NOTE — Progress Notes (Addendum)
PULMONARY / CRITICAL CARE MEDICINE   NAME:  Ernest Sanchez, MRN:  [id number], DOB:  Jun 12, 1926, LOS: 3 ADMISSION DATE:  01/15/2018, CONSULTATION DATE: 01/15/2018 REFERRING MD: [facility] emergency room, CHIEF COMPLAINT: Hypotension  BRIEF HISTORY:    84 year old male with hypothyroidism hypertension on chronic anticoagulation for mitral valve coming in with left lower limb swelling redness suggestive of cellulitis and hypotension second sepsis. Deemed to have hematoma  SIGNIFICANT EVENTS:  12/7 admit for LLE swelling  12/8 found to have L gastrocnemius hematoma. Vascular surgery consulted.  12/9 patient and family have opted for surgery 12/10  STUDIES:   Ultrasound Doppler>> no evidence DVT CT LLE 12/8 > Generalized soft tissue edema in the subcutaneous fat circumferentially around the left lower leg as can be seen with cellulitis versus venous insufficiency. 7.3 x 4.2 x 17 cm intramuscular hematoma in the medial gastrocnemius muscle. No soft tissue emphysema to suggest necrotizing infection. Small hemarthrosis. CT chest 12/8 > 4.4 cm posterior left lower lobe mass, compatible primary bronchogenic neoplasm. Small mediastinal nodes, suspicious for nodal metastases. Hilar regions are suboptimally evaluated in the absence of intravenous Contrast. Associated small left pleural effusion, possibly malignant. 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  CULTURES:  Blood cultures>> pending  ANTIBIOTICS:  Zosyn>> 12/7 >>> Vancomycin>>12/7 >12/9  LINES/TUBES:  Peripheral IVs  CONSULTANTS:  Vascular Surgery  SUBJECTIVE:  States his leg hurts at suture lines or whenever anything touches his leg. Is requesting a breathing treatment.  CONSTITUTIONAL: BP 133/85   Pulse 75   Temp 98.3 F (36.8 C) (Oral)   Resp (!) 25   Ht 6' (1.829 m)   Wt 86.3 kg   SpO2 92%   BMI 25.80 kg/m   I/O last 3 completed shifts: In: 2663.5 [P.O.:240; I.V.:2258; IV  Piggyback:165.4] Out: 2970 [Urine:2520; Drains:450]     PHYSICAL EXAM:  General appearance: elderly male in NAD. Hard of hearing. R ear better. HEENT: [place]/AT, PERRL, no JVD, , No LAD Lungs:bilateral chest excursion, Clear with few wheezes noted CV: S1, S2, RRR systolic murmur left lower sternal border, No gallop, no rub Abdomen: Soft, non-tender; non-distended, BS +, Body mass index is 25.8 kg/m. Extremities: LLE with wound vac, serous drainage, bilateral palpable pedal pulse, brisk refill L>R Skin: Surgical incision with wound vac in place Psych: Appropriate affect no evidence of active delirium, not anxious Neuro: Awake and alert, follows commands, MAE x 4, A&O to person and place   RESOLVED PROBLEM LIST  Possible Severe sepsis, septic shock, hypotension resolved  ASSESSMENT AND PLAN    Left gastrocnemius hematoma - vascular surgery following - 4 compartment fasciotomies 12/9 - Wound Vac placement 12/9 >> for change 12/11  Acute left lower limb cellulitis (possible), with concomitant peripheral arterial disease. T max 100.7 WBC 3.2 on 12/10 - Continue ABX for 7 day course. Low threshold to DC - DC vancomycin - Trend WBC and fever curve - Culture as is clinically indicated  Acute kidney injury:  Elevated CK 717 likely secondary to sepsis CK 276 on 12/9 >> downtrending Creatinine continues to down trend Plan: Follow BMET Trend UO   Anemia -Multifactorial likely secondary hematoma/sepsis - HGB 7.5 on 12/10 - Wound vac output 225 cc serous sang Plan -Continue to follow wound vac output - Trend CBC - Monitor for bleeding - Transfuse for HGB < 7 per primary team  Recent diagnosis of 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 under investigation. CT chest 12/8 confirms. - Rather complex  situation with high risk patient. - Currently not on anticoagulation -Since he is currently admitted may be candidate for CT-guided biopsy of hepatic lesion to  stage and diagnose at same time. Effusion very small, but may be malignant. Perhaps IR could perform diagnostic thora. Would be difficult.   Emphysema  Smoking history Plan - Saturations 92% on RA - Continue Duonebs prn  Left lower limb chronic ischemia - per vascular >> following  Mitral valve on chronic anticoagulation Chronic systolic congestive heart failure ejection fraction 30%- No current evidence of decompensation of heart failure + 4 L since admission -Judicious fluid resuscitation - Anticoagulation on hold since 12/8.  Anticoagulation is for mechanical mitral valve and atrial fibrillation per PCP ( Dr[facility] Sharlett [facility]). Pt INR goal is 2.5-3.5 Heparin bribge will need to be re-ordered when clinically appropriate in mechanical valve patient.     Best Practice / Goals of Care / Disposition.   DVT PROPHYLAXIS: per primary team SUP: None NUTRITION: Cardiac diet MOBILITY: Bedrest GOALS OF CARE: Full code FAMILY DISCUSSIONS: Updated patient, no family at bedside 12/10 DISPOSITION Step Down  LABS  Glucose Recent Labs  Lab 01/15/18 2152 01/15/18 2335  GLUCAP 76 121*    BMET Recent Labs  Lab 01/16/18 0051 01/17/18 1231 01/18/18 0331  NA 135 139 140  K 4.2 4.8 5.1  CL 102 110 111  CO2 22 21* 20*  BUN 63* 47* 44*  CREATININE 3.76* 2.12* 1.93*  GLUCOSE 115* 106* 131*    Liver Enzymes Recent Labs  Lab 01/15/18 1531  AST 36  [name] 13  ALKPHOS 46  BILITOT 1.7*  ALBUMIN 3.8    Electrolytes Recent Labs  Lab 01/16/18 0051 01/17/18 1231 01/18/18 0331  CALCIUM 8.3* 8.7* 8.6*    CBC Recent Labs  Lab 01/17/18 0406 01/17/18 2035 01/18/18 0331  WBC 4.0 3.9* 3.2*  HGB 8.2* 7.5* 7.2*  HCT 26.8* 24.6* 23.7*  PLT 102* 99* 103*    ABG No results for input(s): PHART, PCO2ART, PO2ART in the last 168 hours.  Coag's Recent Labs  Lab 01/16/18 0051 01/17/18 0406 01/18/18 0331  INR 1.49 1.58 1.83    Sepsis Markers Recent Labs  Lab  01/15/18 1542  LATICACIDVEN 1.48    Cardiac Enzymes Recent Labs  Lab 01/15/18 1531  TROPONINI 0.09*    My critical care time excluding procedures: 30 minutes   Georgann Housekeeper, AGACNP-BC [place] Pager [phone] or [phone]  01/18/2018 12:35 PM

## 2018-01-18 NOTE — Progress Notes (Signed)
500 ml Bolus NS given, pt's bp has increased and is within normal range. Clot was developed at the site where wound vac wasn't able to suction properly. Clot was removed, tegaderm placed to redress the wound. Clots were in cannister causing blockage. Cannister changed. Output was 225 ml. Hgb decreased to 7.5. Another CBC to be taken in am. Will continue to monitor.  Early Osmond Daxx Tiggs,RN

## 2018-01-18 NOTE — Progress Notes (Addendum)
     Subjective  - Doing OK, pain at incision sites.   Objective (!) 103/49 75 98.3 F (36.8 C) (Oral) (!) 24 92%  Intake/Output Summary (Last 24 hours) at 01/18/2018 0731 Last data filed at 01/18/2018 0600 Gross per 24 hour  Intake 865.1 ml  Output 2145 ml  Net -1279.9 ml    Left LE with intact wound vac, 450 cc out put since surgery Motor dorsiflexion and plantarflexion intact, doppler brisk DP, weaker PT/Peroneal on the left.   Assessment/Planning: POD # 1 wound vac placement after 4 compartment fasciotomies  Plan to change wound vac at bedside tomorrow.  Roxy Horseman 01/18/2018 7:31 AM --  Laboratory Lab Results: Recent Labs    01/17/18 2035 01/18/18 0331  WBC 3.9* 3.2*  HGB 7.5* 7.2*  HCT 24.6* 23.7*  PLT 99* 103*   BMET Recent Labs    01/17/18 1231 01/18/18 0331  NA 139 140  K 4.8 5.1  CL 110 111  CO2 21* 20*  GLUCOSE 106* 131*  BUN 47* 44*  CREATININE 2.12* 1.93*  CALCIUM 8.7* 8.6*    COAG Lab Results  Component Value Date   INR 1.83 01/18/2018   INR 1.58 01/17/2018   INR 1.49 01/16/2018   No results found for: PTT    I have interviewed and examined patient with PA and agree with assessment and plan above.   Makyi Ledo C. Donzetta Matters, MD [facility] [place] Office: [phone] Pager: [phone]

## 2018-01-19 DIAGNOSIS — R911 Solitary pulmonary nodule: Secondary | ICD-10-CM

## 2018-01-19 LAB — PROTIME-INR
INR: 1.74
PROTHROMBIN TIME: 20.1 s — AB (ref 11.4–15.2)

## 2018-01-19 LAB — HEPARIN LEVEL (UNFRACTIONATED): HEPARIN UNFRACTIONATED: 0.24 [IU]/mL — AB (ref 0.30–0.70)

## 2018-01-19 MED ORDER — WARFARIN - PHARMACIST DOSING INPATIENT
Freq: Every day | Status: DC
  Administered 2018-01-21: 18:00:00

## 2018-01-19 MED ORDER — POLYETHYLENE GLYCOL 3350 17 G PO PACK
17.0000 g | PACK | Freq: Every day | ORAL | Status: DC
  Administered 2018-01-19 – 2018-01-21 (×3): 17 g via ORAL
  Filled 2018-01-19 (×3): qty 1

## 2018-01-19 MED ORDER — SENNA 8.6 MG PO TABS
2.0000 | ORAL_TABLET | Freq: Two times a day (BID) | ORAL | Status: DC
  Administered 2018-01-19 – 2018-01-22 (×6): 17.2 mg via ORAL
  Filled 2018-01-19 (×6): qty 2

## 2018-01-19 MED ORDER — WARFARIN SODIUM 5 MG PO TABS
5.0000 mg | ORAL_TABLET | Freq: Once | ORAL | Status: AC
  Administered 2018-01-19: 5 mg via ORAL
  Filled 2018-01-19: qty 1

## 2018-01-19 MED ORDER — HEPARIN (PORCINE) 25000 UT/250ML-% IV SOLN
1500.0000 [IU]/h | INTRAVENOUS | Status: DC
  Administered 2018-01-19: 1400 [IU]/h via INTRAVENOUS
  Administered 2018-01-20 – 2018-01-21 (×3): 1500 [IU]/h via INTRAVENOUS
  Filled 2018-01-19 (×4): qty 250

## 2018-01-19 NOTE — Progress Notes (Signed)
ANTICOAGULATION CONSULT NOTE - Follow Up Consult  Pharmacy Consult for Heparin/Coumadin Indication: mechanical mitral valve, afib  Allergies  Allergen Reactions  . Zantac [Ranitidine Hcl]     Unknown reaction    Patient Measurements: Height: 6' (182.9 cm) Weight: 190 lb 11.2 oz (86.5 kg) IBW/kg (Calculated) : 77.6  Vital Signs: Temp: 98.5 F (36.9 C) (12/11 0424) Temp Source: Oral (12/11 0424) BP: 155/93 (12/11 0506) Pulse Rate: 102 (12/11 0506)  Labs: Recent Labs    01/16/18 1909  01/17/18 0406 01/17/18 1231 01/17/18 2035 01/18/18 0331 01/18/18 2042 01/19/18 0406  HGB  --    < > 8.2*  --  7.5* 7.2* 8.0*  --   HCT  --    < > 26.8*  --  24.6* 23.7* 27.0*  --   PLT  --    < > 102*  --  99* 103*  --   --   LABPROT  --   --  18.6*  --   --  21.0*  --  20.1*  INR  --   --  1.58  --   --  1.83  --  1.74  HEPARINUNFRC 0.44  --  <0.10*  --   --   --   --   --   CREATININE  --   --   --  2.12*  --  1.93*  --   --   CKTOTAL 456*  --   --  276  --   --   --   --    < > = values in this interval not displayed.    Estimated Creatinine Clearance: 28.5 mL/min (A) (by C-G formula based on SCr of 1.93 mg/dL (H)).  Assessment: 84 y/o M on Coumadin PTA for mechanical mitral valve + hx Afib. Coumadin has been held past 12/7 dose as pt for interventions for L calf hematoma. Heparin bridge started 12/7. Heparin off since 12/8 PM d/t oozing from line/hematoma. Pt s/p hematoma evac 12/9 INR 1.74 (no coumadin since 12/7).  *home Coumadin dose: 2mg  daily except 4mg  MWF  12/10: I spoke with [facility] (Dr. Sharlett Iles) who follows this pt's coumadin. He confirmed that pt does have mechanical MVR and afib with goal INR 2.5-3.5. Relayed this info to CCM/vascular/[facility].   Pt to restart heparin bridge to coumadin today - okayed by vascular. Hgb 8 (relatively stable past few days), plts 154 > 103.  Pt previously therapeutic this admission on heparin gtt at 1400 units/hr.  Goal  of Therapy:  INR goal 2.5-3.5 Heparin level 0.3-0.7 units/ml Monitor platelets by anticoagulation protocol: Yes   Plan:  Start heparin drip at 1400 units/hr. No bolus Will f/u 8 hr heparin level past gtt start Daily CBC/HL/INR Coumadin 5mg  po tonight   Sherlon Handing, PharmD, BCPS Clinical pharmacist  **Pharmacist phone directory can now be found on [email or web address] (PW [email or web address]).  Listed under [place]. 01/19/2018 2:39 PM

## 2018-01-19 NOTE — Progress Notes (Addendum)
PROGRESS NOTE  Ernest Sanchez GYB:[id number] DOB: 1926-03-16 DOA: 01/15/2018 PCP: Leanna Battles, MD  HPI/Recap of past 26 hours: 84 year old male with hypothyroidism hypertension on chronic anticoagulation for mitral valve coming in with left lower limb swelling redness suggestive of cellulitis and hypotension second sepsis. Deemed to have hematoma.  No evidence of DVT.  POD #2 post wound VAC placement after 4 compartment fasciotomies.  01/19/2018: Patient seen and examined at his bedside.  No acute events overnight.  States his pain is well controlled on current pain management.  Wound VAC change at bedside today by vascular surgery 01/19/2018.    Assessment/Plan: Active Problems:   Hypotension due to hypovolemia   Cellulitis   Severe sepsis (HCC)   Hematoma of left lower extremity  Left gastrocnemius hematoma POD #2 post wound VAC placement after 4 compartment fasciotomies Wound VAC replacement today 01/19/2018 Vascular surgery following  Acute left lower limb cellulitis with concomitant peripheral arterial disease Presented with T-max of 100.7, WBC 3.2 Continue antibiotics Continue to trend WBC and fever curve DC vancomycin Continue IV Zosyn  Mitral valve replacement on chronic anticoagulation Currently subtherapeutic INR on Coumadin Restart heparin drip Pharmacy managing  Subtherapeutic INR INR 1.74 Goal INR 2.5-3.5 Start hep drip to brigde w coumadin  AKI on CKD3 Improving Cr trending down Repeat BMP am  Acute blood loss anemia Presented with hemoglobin of 7 Post 1 unit PRBC transfusion Hemoglobin stable today at 8.0 No sign of overt bleeding Repeat labs in the morning  Chronic systolic CHF Last 2D echo done on 01/09/2018 revealed LVEF 30 to 35% with diffuse hypokinesis with relative.  Apical akinesis continue current medications  Hypothyroidism Continue levothyroxine    DVT PROPHYLAXIS:  Heparin drip NUTRITION: Cardiac diet MOBILITY: Bedrest GOALS  OF CARE: Full code FAMILY DISCUSSIONS:  None at bedside DISPOSITION  Home possibly tomorrow 01/20/2018 or when vascular surgery signs of.   Objective: Vitals:   01/18/18 1851 01/18/18 1900 01/19/18 0424 01/19/18 0506  BP: 127/82 (!) 122/59 (!) 159/87 (!) 155/93  Pulse:  78 91 (!) 102  Resp:  19 (!) 23 (!) 22  Temp: 98.5 F (36.9 C) 98.2 F (36.8 C) 98.5 F (36.9 C)   TempSrc: Oral Oral Oral   SpO2:  96% 95% 97%  Weight:   86.5 kg   Height:        Intake/Output Summary (Last 24 hours) at 01/19/2018 1428 Last data filed at 01/19/2018 1018 Gross per 24 hour  Intake 2708.88 ml  Output 975 ml  Net 1733.88 ml   Filed Weights   01/17/18 0500 01/18/18 0500 01/19/18 0424  Weight: 79.8 kg 86.3 kg 86.5 kg    Exam:  . General: 84 y.o. year-old male well developed well nourished in no acute distress.  Alert and interactive. . Cardiovascular: Regular rate and rhythm with no rubs or gallops.  No thyromegaly or JVD noted.   Marland Kitchen Respiratory: Clear to auscultation with no wheezes or rales. Good inspiratory effort. . Abdomen: Soft nontender nondistended with normal bowel sounds x4 quadrants. . Musculoskeletal: No lower extremity edema. 2/4 pulses in all 4 extremities. . Skin: LLE with wound vac in place . Psychiatry: Mood is appropriate for condition and setting   Data Reviewed: CBC: Recent Labs  Lab 01/15/18 1531 01/16/18 0051 01/16/18 1937 01/17/18 0406 01/17/18 2035 01/18/18 0331 01/18/18 2042  WBC 8.8 8.1 5.0 4.0 3.9* 3.2*  --   NEUTROABS 6.1  --  3.5  --   --   --   --  HGB 10.5* 9.4* 9.2* 8.2* 7.5* 7.2* 8.0*  HCT 34.0* 30.5* 30.3* 26.8* 24.6* 23.7* 27.0*  MCV 103.3* 102.0* 103.4* 104.3* 106.0* 104.4*  --   PLT 147* 154 100* 102* 99* 103*  --    Basic Metabolic Panel: Recent Labs  Lab 01/15/18 1531 01/16/18 0051 01/17/18 1231 01/18/18 0331  NA 134* 135 139 140  K 4.3 4.2 4.8 5.1  CL 99 102 110 111  CO2 23 22 21* 20*  GLUCOSE 102* 115* 106* 131*  BUN 69* 63*  47* 44*  CREATININE 4.69* 3.76* 2.12* 1.93*  CALCIUM 9.1 8.3* 8.7* 8.6*   GFR: Estimated Creatinine Clearance: 28.5 mL/min (A) (by C-G formula based on SCr of 1.93 mg/dL (H)). Liver Function Tests: Recent Labs  Lab 01/15/18 1531  AST 36  [name] 13  ALKPHOS 46  BILITOT 1.7*  PROT 7.6  ALBUMIN 3.8   No results for input(s): LIPASE, AMYLASE in the last 168 hours. No results for input(s): AMMONIA in the last 168 hours. Coagulation Profile: Recent Labs  Lab 01/15/18 1531 01/16/18 0051 01/17/18 0406 01/18/18 0331 01/19/18 0406  INR 1.41 1.49 1.58 1.83 1.74   Cardiac Enzymes: Recent Labs  Lab 01/15/18 1531 01/16/18 0051 01/16/18 1909 01/17/18 1231  CKTOTAL  --  717* 456* 276  TROPONINI 0.09*  --   --   --    BNP (last 3 results) No results for input(s): PROBNP in the last 8760 hours. HbA1C: No results for input(s): HGBA1C in the last 72 hours. CBG: Recent Labs  Lab 01/15/18 2152 01/15/18 2335  GLUCAP 76 121*   Lipid Profile: No results for input(s): CHOL, HDL, LDLCALC, TRIG, CHOLHDL, LDLDIRECT in the last 72 hours. Thyroid Function Tests: No results for input(s): TSH, T4TOTAL, FREET4, T3FREE, THYROIDAB in the last 72 hours. Anemia Panel: No results for input(s): VITAMINB12, FOLATE, FERRITIN, TIBC, IRON, RETICCTPCT in the last 72 hours. Urine analysis:    Component Value Date/Time   COLORURINE YELLOW 01/15/2018 2021   APPEARANCEUR CLEAR 01/15/2018 2021   LABSPEC 1.015 01/15/2018 2021   PHURINE 5.5 01/15/2018 2021   GLUCOSEU NEGATIVE 01/15/2018 2021   HGBUR MODERATE (A) 01/15/2018 2021   BILIRUBINUR NEGATIVE 01/15/2018 2021   KETONESUR 15 (A) 01/15/2018 2021   PROTEINUR NEGATIVE 01/15/2018 2021   NITRITE NEGATIVE 01/15/2018 2021   LEUKOCYTESUR NEGATIVE 01/15/2018 2021   Sepsis Labs: @LABRCNTIP (procalcitonin:4,lacticidven:4)  ) Recent Results (from the past 240 hour(s))  Culture, blood (Routine x 2)     Status: None (Preliminary result)   Collection  Time: 01/15/18  3:34 PM  Result Value Ref Range Status   Specimen Description   Final    BLOOD BLOOD LEFT FOREARM Performed at [facility], [address] [place], [place] [address]    Special Requests   Final    BOTTLES DRAWN AEROBIC AND ANAEROBIC Blood Culture results may not be optimal due to an inadequate volume of blood received in culture bottles Performed at [facility], [address] [place], [place] [address]    Culture   Final    NO GROWTH 3 DAYS Performed at [facility], [place] [address], [place], [place] [address]    Report Status PENDING  Incomplete  Culture, blood (Routine x 2)     Status: None (Preliminary result)   Collection Time: 01/15/18  3:44 PM  Result Value Ref Range Status   Specimen Description   Final    BLOOD RIGHT ANTECUBITAL Performed at [facility], [address] [place], [place] [address]    Special Requests   Final    BOTTLES DRAWN AEROBIC AND ANAEROBIC Blood Culture adequate volume Performed at [facility], [address] [place], [place] [address]    Culture   Final    NO GROWTH 3 DAYS Performed at [facility], [place] [address] [place], [place] [address]    Report Status PENDING  Incomplete  MRSA PCR Screening     Status: None   Collection Time: 01/15/18  9:49 PM  Result Value Ref Range Status   MRSA by PCR NEGATIVE NEGATIVE Final    Comment:        The GeneXpert MRSA Assay (FDA approved for NASAL specimens only), is one component of a comprehensive MRSA colonization surveillance program. It is not intended to diagnose MRSA infection nor to guide or monitor treatment for MRSA infections. Performed at [facility], [place] [address], [place], [place] [address]       Studies: No results found.  Scheduled Meds: . dorzolamide  1 drop Both Eyes BID  . latanoprost  1 drop Both Eyes QHS  . levothyroxine  100 mcg Oral Q0600    Continuous Infusions: . sodium chloride  Stopped (01/15/18 1928)  . sodium chloride 50 mL/hr at 01/18/18 0538  . lactated ringers Stopped (01/17/18 2124)  . piperacillin-tazobactam (ZOSYN)  IV 3.375 g (01/19/18 0514)     LOS: 4 days     Kayleen Memos, MD [facility] Pager [phone]  If 7PM-7AM, please contact night-coverage [email or web address] Password [email or web address] 01/19/2018, 2:28 PM

## 2018-01-19 NOTE — Progress Notes (Signed)
  Progress Note    01/19/2018 1:13 PM 2 Days Post-Op  Subjective: Doing well, pain in left leg improved today  Vitals:   01/19/18 0424 01/19/18 0506  BP: (!) 159/87 (!) 155/93  Pulse: 91 (!) 102  Resp: (!) 23 (!) 22  Temp: 98.5 F (36.9 C)   SpO2: 95% 97%    Physical Exam: Awake alert oriented Moving left lower extremity well Wound beds appear healthy  CBC    Component Value Date/Time   WBC 3.2 (L) 01/18/2018 0331   RBC 2.27 (L) 01/18/2018 0331   HGB 8.0 (L) 01/18/2018 2042   HGB 13.2 04/10/2009 1412   HCT 27.0 (L) 01/18/2018 2042   HCT 41.3 04/10/2009 1412   PLT 103 (L) 01/18/2018 0331   PLT 149 04/10/2009 1412   MCV 104.4 (H) 01/18/2018 0331   MCV 88.4 04/10/2009 1412   MCH 31.7 01/18/2018 0331   MCHC 30.4 01/18/2018 0331   RDW 13.5 01/18/2018 0331   RDW 16.1 (H) 04/10/2009 1412   LYMPHSABS 0.5 (L) 01/16/2018 1937   LYMPHSABS 1.0 04/10/2009 1412   MONOABS 0.9 01/16/2018 1937   MONOABS 0.8 04/10/2009 1412   EOSABS 0.1 01/16/2018 1937   EOSABS 0.2 04/10/2009 1412   BASOSABS 0.0 01/16/2018 1937   BASOSABS 0.0 04/10/2009 1412    BMET    Component Value Date/Time   NA 140 01/18/2018 0331   K 5.1 01/18/2018 0331   CL 111 01/18/2018 0331   CO2 20 (L) 01/18/2018 0331   GLUCOSE 131 (H) 01/18/2018 0331   BUN 44 (H) 01/18/2018 0331   CREATININE 1.93 (H) 01/18/2018 0331   CALCIUM 8.6 (L) 01/18/2018 0331   GFRNONAA 30 (L) 01/18/2018 0331   GFRAA 35 (L) 01/18/2018 0331    INR    Component Value Date/Time   INR 1.74 01/19/2018 0406     Intake/Output Summary (Last 24 hours) at 01/19/2018 1313 Last data filed at 01/19/2018 1018 Gross per 24 hour  Intake 2708.88 ml  Output 975 ml  Net 1733.88 ml     Assessment:  84 y.o. male is s/p 4 compartment fasciotomy left lower extremity for spontaneous gastrocnemius hematoma.  Wound VAC change at bedside today  Plan: Wound does not appear to be closed with this time.  We will continue wound VAC therapy.   Patient may need to be discharged home with wound VAC.  Okay to restart anticoagulation from vascular standpoint.  Azaria Stegman C. Donzetta Matters, MD [facility] Office: [phone] Pager: [phone]  01/19/2018 1:13 PM

## 2018-01-19 NOTE — Progress Notes (Signed)
PULMONARY / CRITICAL CARE MEDICINE   NAME:  Ernest Sanchez, MRN:  [id number], DOB:  [birth year], LOS: 4 ADMISSION DATE:  01/15/2018, CONSULTATION DATE: 01/15/2018 REFERRING MD: [facility] emergency room, CHIEF COMPLAINT: Hypotension  BRIEF HISTORY:    84 year old male with hypothyroidism hypertension on chronic anticoagulation for mitral valve coming in with left lower limb swelling redness suggestive of cellulitis and hypotension second sepsis. Deemed to have hematoma  SIGNIFICANT EVENTS:  12/7 admit for LLE swelling  12/8 found to have L gastrocnemius hematoma. Vascular surgery consulted.  12/9 patient and family have opted for surgery 12/10  STUDIES:   Ultrasound Doppler>> no evidence DVT CT LLE 12/8 > Generalized soft tissue edema in the subcutaneous fat circumferentially around the left lower leg as can be seen with cellulitis versus venous insufficiency. 7.3 x 4.2 x 17 cm intramuscular hematoma in the medial gastrocnemius muscle. No soft tissue emphysema to suggest necrotizing infection. Small hemarthrosis. CT chest 12/8 > 4.4 cm posterior left lower lobe mass, compatible primary bronchogenic neoplasm. Small mediastinal nodes, suspicious for nodal metastases. Hilar regions are suboptimally evaluated in the absence of intravenous Contrast. Associated small left pleural effusion, possibly malignant. 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  CULTURES:  Blood cultures>> pending  ANTIBIOTICS:  Zosyn>> 12/7 >>> Vancomycin>>12/7 >12/9  LINES/TUBES:  Peripheral IVs  CONSULTANTS:  Vascular Surgery  SUBJECTIVE:    CONSTITUTIONAL: BP (!) 155/93 (BP Location: Left Arm)   Pulse (!) 102   Temp 98.5 F (36.9 C) (Oral)   Resp (!) 22   Ht 6' (1.829 m)   Wt 86.5 kg   SpO2 97%   BMI 25.86 kg/m   I/O last 3 completed shifts: In: 3658.9 [P.O.:1070; I.V.:1456.8; Blood:1017; IV Piggyback:115.1] Out: 2542 [Urine:1300; Drains:375]     PHYSICAL EXAM:   General appearance: elderly male in NAD. Hard of hearing. R ear better. HEENT: [place]/AT, PERRL, no JVD, , No LAD Lungs:bilateral chest excursion, Clear with few wheezes noted CV: S1, S2, RRR systolic murmur left lower sternal border, No gallop, no rub Abdomen: Soft, non-tender; non-distended, BS +, Body mass index is 25.86 kg/m. Extremities: LLE with wound vac, serous drainage, bilateral palpable pedal pulse, brisk refill L>R Skin: Surgical incision with wound vac in place Psych: Appropriate affect no evidence of active delirium, not anxious Neuro: Awake and alert, follows commands, MAE x 4, A&O to person and place   RESOLVED PROBLEM LIST  Possible Severe sepsis, septic shock, hypotension resolved  ASSESSMENT AND PLAN    Left gastrocnemius hematoma - appreciate VVS management  Abnormal CT chest.  New rounded left lower lobe peripheral lesion concerning for possible malignancy.  Also notes some smaller less well-defined hepatic lesions seen on his CT angiogram.  -Depending on overall goals for care we could either undertake watchful waiting given his recent illness, his age.  Options for creating a biopsy strategy may depend on how obtainable the liver lesions are.  I would recommend speaking with radiology to see if there is any benefit to getting further hepatic imaging, possibly MRI, to better characterize the liver lesions and see if they are good targets for interventional radiology.  The left lower lobe opacity would also be a reasonable target for transthoracic needle biopsy but this procedure may carry increased risk compared with hepatic biopsy.  I suspect that in either event interventional radiology will want a PET scan as an outpatient in order to plan a biopsy strategy if that is the direction he and the family  want to go in.  If he like to follow-up with pulmonary as an outpatient to discuss further we be happy to see him and help decide best strategy.   Baltazar Apo, MD,  PhD 01/19/2018, 4:41 PM [facility] [phone] or if no answer [phone]

## 2018-01-19 NOTE — Progress Notes (Signed)
ANTICOAGULATION CONSULT NOTE - Follow Up Consult  Pharmacy Consult for Heparin/Coumadin Indication: mechanical mitral valve, afib  Allergies  Allergen Reactions  . Zantac [Ranitidine Hcl]     Unknown reaction    Patient Measurements: Height: 6' (182.9 cm) Weight: 190 lb 11.2 oz (86.5 kg) IBW/kg (Calculated) : 77.6  Vital Signs: Temp: 97.9 F (36.6 C) (12/11 2043) Temp Source: Oral (12/11 2043) BP: 130/58 (12/11 2043) Pulse Rate: 50 (12/11 2043)  Labs: Recent Labs    01/17/18 0406 01/17/18 1231 01/17/18 2035 01/18/18 0331 01/18/18 2042 01/19/18 0406 01/19/18 2234  HGB 8.2*  --  7.5* 7.2* 8.0*  --   --   HCT 26.8*  --  24.6* 23.7* 27.0*  --   --   PLT 102*  --  99* 103*  --   --   --   LABPROT 18.6*  --   --  21.0*  --  20.1*  --   INR 1.58  --   --  1.83  --  1.74  --   HEPARINUNFRC <0.10*  --   --   --   --   --  0.24*  CREATININE  --  2.12*  --  1.93*  --   --   --   CKTOTAL  --  276  --   --   --   --   --     Estimated Creatinine Clearance: 28.5 mL/min (A) (by C-G formula based on SCr of 1.93 mg/dL (H)).  Assessment: 84 y/o M on Coumadin PTA for mechanical mitral valve + hx Afib. Coumadin has been held past 12/7 dose as pt for interventions for L calf hematoma. Heparin bridge started 12/7. Heparin off since 12/8 PM d/t oozing from line/hematoma. Pt s/p hematoma evac 12/9 INR 1.74 (no coumadin since 12/7).  *home Coumadin dose: 2mg  daily except 4mg  MWF  12/10: I spoke with [facility] (Dr. Sharlett Iles) who follows this pt's coumadin. He confirmed that pt does have mechanical MVR and afib with goal INR 2.5-3.5. Relayed this info to CCM/vascular/[facility].   Pt to restart heparin bridge to coumadin today - okayed by vascular. Hgb 8 (relatively stable past few days), plts 154 > 103.  Heparin level 0.24 units/ml  Goal of Therapy:  INR goal 2.5-3.5 Heparin level 0.3-0.7 units/ml Monitor platelets by anticoagulation protocol: Yes   Plan:  Increase  heparin drip to 1500 units/hr Will f/u 8 hr heparin level Daily CBC/HL/INR Excell Seltzer, PharmD Clinical pharmacist  01/19/2018 11:58 PM

## 2018-01-19 NOTE — Care Management Important Message (Signed)
Important Message  Patient Details  Name: Ernest Sanchez MRN: [id number] Date of Birth: 10-24-1926   Medicare Important Message Given:  Yes    Kelda Azad P Shanequa Whitenight 01/19/2018, 1:00 PM

## 2018-01-20 ENCOUNTER — Telehealth: Payer: Self-pay | Admitting: Emergency Medicine

## 2018-01-20 LAB — BASIC METABOLIC PANEL
ANION GAP: 10 (ref 5–15)
BUN: 26 mg/dL — ABNORMAL HIGH (ref 8–23)
CO2: 21 mmol/L — ABNORMAL LOW (ref 22–32)
CREATININE: 1.66 mg/dL — AB (ref 0.61–1.24)
Calcium: 9.6 mg/dL (ref 8.9–10.3)
Chloride: 112 mmol/L — ABNORMAL HIGH (ref 98–111)
GFR calc Af Amer: 42 mL/min — ABNORMAL LOW (ref >60)
GFR calc non Af Amer: 36 mL/min — ABNORMAL LOW (ref >60)
Glucose, Bld: 119 mg/dL — ABNORMAL HIGH (ref 70–99)
Potassium: 4.8 mmol/L (ref 3.5–5.1)
Sodium: 143 mmol/L (ref 135–145)

## 2018-01-20 LAB — HEPARIN LEVEL (UNFRACTIONATED): Heparin Unfractionated: 0.34 IU/mL (ref 0.30–0.70)

## 2018-01-20 LAB — CBC
HCT: 28.5 % — ABNORMAL LOW (ref 39.0–52.0)
Hemoglobin: 8.6 g/dL — ABNORMAL LOW (ref 13.0–17.0)
MCH: 30.4 pg (ref 26.0–34.0)
MCHC: 30.2 g/dL (ref 30.0–36.0)
MCV: 100.7 fL — ABNORMAL HIGH (ref 80.0–100.0)
Platelets: 102 10*3/uL — ABNORMAL LOW (ref 150–400)
RBC: 2.83 MIL/uL — ABNORMAL LOW (ref 4.22–5.81)
RDW: 15.2 % (ref 11.5–15.5)
WBC: 3.1 10*3/uL — ABNORMAL LOW (ref 4.0–10.5)
nRBC: 0 % (ref 0.0–0.2)

## 2018-01-20 LAB — PROTIME-INR
INR: 1.71
Prothrombin Time: 19.8 seconds — ABNORMAL HIGH (ref 11.4–15.2)

## 2018-01-20 MED ORDER — TAMSULOSIN HCL 0.4 MG PO CAPS
0.4000 mg | ORAL_CAPSULE | Freq: Every day | ORAL | Status: DC
  Administered 2018-01-20 – 2018-01-22 (×3): 0.4 mg via ORAL
  Filled 2018-01-20 (×3): qty 1

## 2018-01-20 MED ORDER — METOPROLOL SUCCINATE ER 100 MG PO TB24: 100.0000 mg | ORAL_TABLET | Freq: Every day | ORAL | Status: DC

## 2018-01-20 MED ORDER — METOPROLOL SUCCINATE ER 25 MG PO TB24
25.0000 mg | ORAL_TABLET | Freq: Every day | ORAL | Status: DC
  Administered 2018-01-20 – 2018-01-22 (×3): 25 mg via ORAL
  Filled 2018-01-20 (×3): qty 1

## 2018-01-20 MED ORDER — WARFARIN SODIUM 5 MG PO TABS
5.0000 mg | ORAL_TABLET | Freq: Once | ORAL | Status: AC
  Administered 2018-01-20: 5 mg via ORAL
  Filled 2018-01-20: qty 1

## 2018-01-20 NOTE — Care Management Note (Addendum)
Case Management Note Marvetta Gibbons RN, BSN Transitions of Care Unit 4E- RN Case Manager [phone]  Patient Details  Name: Kamari Buch MRN: [id number] Date of Birth: 1926-09-29  Subjective/Objective:   Pt admitted with hypotension, sepsis, spontaneous gastrocnemius hematoma- s/p 4 compartment fasciotomy with wound VAC placment               Action/Plan: PTA pt lived at home alone, has family nearby that assist as needed (daughter, granddaughter)- per MD notes pt may need home wound VAC for discharge- [facility] home wound vac form has been signed- orders placed for HHRN/PT/OT- spoke with pt/daughter and granddaughter at bedside- provided CMS list for [facility] agency- per daughter they would like time to look over agencies- they are concerned about pt's urinary retention and plan per urology, pt is also having issues with wound VAC so may go home with wet to dry drsg. Per daughter pt goes to the [facility] and she has contacted them and has been working with them for [employer] [employer] (started about 2 weeks ago) discussed using Medicare benefits for skilled [facility] needs- family is also contemplating a request to have pt transferred to the [facility] hospital for continued care. Cm will f/u with family for transition plans.   Expected Discharge Date:                  Expected Discharge Plan:  [place]  In-House Referral:     Discharge planning Services  CM Consult  Post Acute Care Choice:  Durable Medical Equipment, Home Health Choice offered to:  Adult Children  DME Arranged:  Vac DME Agency:  [place] Arranged:  RN, PT, OT [facility] Agency:     Status of Service:  In process, will continue to follow  If discussed at Long Length of Stay Meetings, dates discussed:    Discharge Disposition:   Additional Comments:  Notified by April at [facility]- for contacts at [facility] for any D/C or transfer needs- Marice [place]- [phone] ext. [phone] or Jonette Pesa ext.  [phone]  Dawayne Patricia, RN 01/20/2018, 4:42 PM

## 2018-01-20 NOTE — Evaluation (Signed)
Physical Therapy Evaluation Patient Details Name: Ernest Sanchez MRN: [id number] DOB: 04/15/1926 Today's Date: 01/20/2018   History of Present Illness  Pt is an 83 y/o male admitted secondary to increased LLE cellulitis and swelling. Pt is s/p 4 compartment fasciotomy with wound vac placement. PMH includes HTN and peripheral neuropathy.   Clinical Impression  Pt admitted secondary to problem above with deficits below. Pt requiring mod A for bed mobility this session. Once sitting EOB, pt had increased drainage at wound vac site, so returned to supine. Notified RN and RN to address. Per pt's family, pt has had increased weakness and pt reports difficulty standing. Feel pt would benefit from SNF level therapies at d/c prior to return home. Will continue to follow acutely to maximize functional mobility independence and safety.     Follow Up Recommendations SNF;Supervision for mobility/OOB    Equipment Recommendations  Other (comment)(TBD)    Recommendations for Other Services       Precautions / Restrictions Precautions Precautions: Other (comment);Fall Precaution Comments: wound vac  Restrictions Weight Bearing Restrictions: No      Mobility  Bed Mobility Overal bed mobility: Needs Assistance Bed Mobility: Supine to Sit;Sit to Supine     Supine to sit: Mod assist Sit to supine: Mod assist   General bed mobility comments: Mod A for LE assist and trunk assist for bed mobility. Once sitting, pt with increased drainage from wound vac site, so returned to supine. Notified RN and NT and RN to address.   Transfers                 General transfer comment: Deferred secondary   Ambulation/Gait                Stairs            Wheelchair Mobility    Modified Rankin (Stroke Patients Only)       Balance Overall balance assessment: Needs assistance Sitting-balance support: No upper extremity supported;Feet supported Sitting balance-Leahy Scale: Fair                                        Pertinent Vitals/Pain Pain Assessment: Faces Faces Pain Scale: Hurts little more Pain Location: LLE Pain Descriptors / Indicators: Aching;Operative site guarding;Grimacing;Guarding Pain Intervention(s): Limited activity within patient's tolerance;Monitored during session;Repositioned    Home Living Family/patient expects to be discharged to:: Private residence Living Arrangements: Alone Available Help at Discharge: Family;Available PRN/intermittently Type of Home: House Home Access: Ramped entrance     Home Layout: One level Home Equipment: Wheelchair - manual;Electric scooter      Prior Function Level of Independence: Independent with assistive device(s)         Comments: Per pt's family, he was very independent. Did use scooter for longer distances.      Hand Dominance        Extremity/Trunk Assessment   Upper Extremity Assessment Upper Extremity Assessment: Generalized weakness    Lower Extremity Assessment Lower Extremity Assessment: LLE deficits/detail;Generalized weakness LLE Deficits / Details: Deficits consistent with post op pain and weakness. Pt with wound vac attached. Required assist with moving LLE.     Cervical / Trunk Assessment Cervical / Trunk Assessment: Kyphotic  Communication   Communication: HOH  Cognition Arousal/Alertness: Awake/alert Behavior During Therapy: WFL for tasks assessed/performed Overall Cognitive Status: Within Functional Limits for tasks assessed  General Comments General comments (skin integrity, edema, etc.): Pt's family present throughout session.     Exercises     Assessment/Plan    PT Assessment Patient needs continued PT services  PT Problem List Decreased strength;Decreased balance;Decreased mobility;Decreased knowledge of use of DME;Pain;Decreased activity tolerance       PT Treatment Interventions DME  instruction;Gait training;Functional mobility training;Therapeutic activities;Therapeutic exercise;Balance training;Patient/family education    PT Goals (Current goals can be found in the Care Plan section)  Acute Rehab PT Goals Patient Stated Goal: For his leg to heal  PT Goal Formulation: With patient Time For Goal Achievement: 02/03/18 Potential to Achieve Goals: Fair    Frequency Min 2X/week   Barriers to discharge Decreased caregiver support      Co-evaluation               AM-PAC PT "6 Clicks" Mobility  Outcome Measure Help needed turning from your back to your side while in a flat bed without using bedrails?: A Lot Help needed moving from lying on your back to sitting on the side of a flat bed without using bedrails?: A Lot Help needed moving to and from a bed to a chair (including a wheelchair)?: A Lot Help needed standing up from a chair using your arms (e.g., wheelchair or bedside chair)?: A Lot Help needed to walk in hospital room?: A Lot Help needed climbing 3-5 steps with a railing? : A Lot 6 Click Score: 12    End of Session Equipment Utilized During Treatment: Gait belt Activity Tolerance: Treatment limited secondary to medical complications (Comment)(increased drainage) Patient left: in bed;with call bell/phone within reach;with bed alarm set;with family/visitor present Nurse Communication: Mobility status;Other (comment)(increased drainage) PT Visit Diagnosis: Other abnormalities of gait and mobility (R26.89);Muscle weakness (generalized) (M62.81);Difficulty in walking, not elsewhere classified (R26.2)    Time: 0947-0962 PT Time Calculation (min) (ACUTE ONLY): 28 min   Charges:   PT Evaluation $PT Eval Moderate Complexity: 1 Mod PT Treatments $Therapeutic Activity: 8-22 mins        Leighton Ruff, PT, DPT  Acute Rehabilitation Services  Pager: [phone] Office: [phone]   Rudean Hitt 01/20/2018, 6:05 PM

## 2018-01-20 NOTE — Consult Note (Signed)
[place] Nurse wound consult note Replacement of Trac pads for VAC completed in [place] in the presence of his daughter and granddaughter.  The patient was very helpful and understanding during the care. Reason for Consult: Assistance with VAC leak. Wound type: LLE surgical sites After changing the lateral Trac pad and restarting VAC therapy, 25 mm Hg negative pressure was obtained.  Once I replaced the medial Trac pad the VAC machine registered 75 mm Hg negative pressure.  No leaks detected.  Blood clots were observed in both of the Trac pads that were removed.  Patient, daughter and granddaughter updated on functioning of equipment as well as the back up plan should the [facility] therapy fail later today.  The patient's primary RN, Tie, participated in care activities as well during my visit.  Thank you for the consult.  Discussed plan of care with the patient and bedside nurse.  [facility] nurse will not follow at this time.  Please re-consult the [place] team if needed.  Val Riles, RN, MSN, CWOCN, CNS-BC, pager [phone]

## 2018-01-20 NOTE — Progress Notes (Addendum)
  Progress Note    01/20/2018 7:52 AM 3 Days Post-Op  Subjective:  Says he is coughing up phlegm.  Says when they got him out of bed this morning, the heart monitor fell on his leg and it started bleeding (lateral incision).  Afebrile   Vitals:   01/19/18 2043 01/20/18 0355  BP: (!) 130/58 (!) 164/96  Pulse: (!) 50 (!) 51  Resp: (!) 23 (!) 25  Temp: 97.9 F (36.6 C) 98.1 F (36.7 C)  SpO2: 99% 100%    Physical Exam: Extremities:  Wound vac medial and lateral with seal, however lateral vac with bloody ooze from distal part of vac.   CBC    Component Value Date/Time   WBC 3.1 (L) 01/20/2018 0635   RBC 2.83 (L) 01/20/2018 0635   HGB 8.6 (L) 01/20/2018 0635   HGB 13.2 04/10/2009 1412   HCT 28.5 (L) 01/20/2018 0635   HCT 41.3 04/10/2009 1412   PLT 102 (L) 01/20/2018 0635   PLT 149 04/10/2009 1412   MCV 100.7 (H) 01/20/2018 0635   MCV 88.4 04/10/2009 1412   MCH 30.4 01/20/2018 0635   MCHC 30.2 01/20/2018 0635   RDW 15.2 01/20/2018 0635   RDW 16.1 (H) 04/10/2009 1412   LYMPHSABS 0.5 (L) 01/16/2018 1937   LYMPHSABS 1.0 04/10/2009 1412   MONOABS 0.9 01/16/2018 1937   MONOABS 0.8 04/10/2009 1412   EOSABS 0.1 01/16/2018 1937   EOSABS 0.2 04/10/2009 1412   BASOSABS 0.0 01/16/2018 1937   BASOSABS 0.0 04/10/2009 1412    BMET    Component Value Date/Time   NA 140 01/18/2018 0331   K 5.1 01/18/2018 0331   CL 111 01/18/2018 0331   CO2 20 (L) 01/18/2018 0331   GLUCOSE 131 (H) 01/18/2018 0331   BUN 44 (H) 01/18/2018 0331   CREATININE 1.93 (H) 01/18/2018 0331   CALCIUM 8.6 (L) 01/18/2018 0331   GFRNONAA 30 (L) 01/18/2018 0331   GFRAA 35 (L) 01/18/2018 0331    INR    Component Value Date/Time   INR 1.71 01/20/2018 0635     Intake/Output Summary (Last 24 hours) at 01/20/2018 0752 Last data filed at 01/20/2018 0454 Gross per 24 hour  Intake 2561.01 ml  Output 1500 ml  Net 1061.01 ml     Assessment:  84 y.o. male is s/p:  4 compartment fasciotomy left  lower extremity for spontaneous gastrocnemius hematoma.  Wound VAC change at bedside yesterday  3 Days Post-Op  Plan: -pt with bloody ooze around lateral wound vac distally.  Vac still with good seal.  May need to hold heparin, but will d/w Dr. Donzetta Matters about vac.  -DVT prophylaxis:  Heparin gtt bridge to coumadin for MVR   Leontine Locket, PA-C [facility] [phone] 01/20/2018 7:52 AM   I have interviewed and examined patient with PA and agree with assessment and plan above.  Lateral wound VAC has malfunction may be can be salvaged with changing the Lilly pad.  Will need to be changed totally tomorrow so if there is an issue can be changed to wet-to-dry overnight.  We will reevaluate the wound in the morning but unlikely that it is ready for closure  Bernie Ransford C. Donzetta Matters, MD [facility] [place] Office: [phone] Pager: [phone]

## 2018-01-20 NOTE — Progress Notes (Signed)
PROGRESS NOTE  Ernest Sanchez ZDG:[id number] DOB: [birth year] DOA: 01/15/2018 PCP: Leanna Battles, MD  HPI/Recap of past 53 hours: 84 year old male with hypothyroidism hypertension on chronic anticoagulation for mitral valve coming in with left lower limb swelling redness suggestive of cellulitis and hypotension second sepsis. Deemed to have hematoma.  No evidence of DVT.  POD #3 post wound VAC placement after 4 compartment fasciotomies.  01/20/2018: Patient seen and examined at his bedside.  Wound VAC noted this morning not to obtain a seal.  Wound care RN consulted and replaced the track pad for VAC.  Patient has no new complaints.  Does not appear to be in distress.   Urinary retention reported by RN this a.m.  Called and spoke with Dr. Junious Silk urology who recommended keeping Foley cath for 5 days and follow-up in his office outpatient.  Patient's daughter expresses discontent with this plan.    Assessment/Plan: Active Problems:   Hypotension due to hypovolemia   Cellulitis   Severe sepsis (HCC)   Hematoma of left lower extremity   Pulmonary nodule  Left gastrocnemius hematoma POD #3 post wound VAC placement after 4 compartment fasciotomies Wound VAC replacement 01/19/2018 Track pads replaced on 01/20/2018 Vascular surgery following  Acute left lower limb cellulitis with concomitant peripheral arterial disease Presented with T-max of 100.7, WBC 3.2 Continue antibiotics IV Zosyn  Newly diagnosed urinary retention Curb sided with urology Dr. Junious Silk who recommended keeping Foley cath for 5 days and following up outpatient in his office after discharge for voiding trial Start Flomax 0.4 mg daily  Mitral valve replacement on chronic anticoagulation Currently subtherapeutic INR on Coumadin Continue heparin drip Pharmacy managing  Subtherapeutic INR INR 1.74 Goal INR 2.5-3.5 Continue hep drip to brigde w coumadin  AKI on CKD3 Baseline creatinine appears to be 1.8with GFR of  32 Presented with creatinine of 4.6  Avoid nephrotoxic agents/dehydration/hypotension Renal ultrasound done on 01/09/2018 unremarkable.  No hydronephrosis. Repeat BMP  Acute blood loss anemia Presented with hemoglobin of 7 Post 1 unit PRBC transfusion Hemoglobin stable No sign of overt bleeding Repeat labs in the morning  Chronic systolic CHF Last 2D echo done on 01/09/2018 revealed LVEF 30 to 35% with diffuse hypokinesis with relative.  Apical akinesis Continue cardio medications   Hypothyroidism Continue levothyroxine    DVT PROPHYLAXIS:  Heparin drip NUTRITION: Cardiac diet MOBILITY: Bedrest GOALS OF CARE: Full code FAMILY DISCUSSIONS:  Updated his daughter at bedside. DISPOSITION  Home possibly tomorrow 01/21/2018 or when vascular surgery signs off.   Objective: Vitals:   01/19/18 2043 01/20/18 0355 01/20/18 0800 01/20/18 1351  BP: (!) 130/58 (!) 164/96 (!) 151/95 136/89  Pulse: (!) 50 (!) 51 100   Resp: (!) 23 (!) 25 (!) 23   Temp: 97.9 F (36.6 C) 98.1 F (36.7 C)    TempSrc: Oral Oral  Oral  SpO2: 99% 100% 96%   Weight:  86.1 kg    Height:        Intake/Output Summary (Last 24 hours) at 01/20/2018 1352 Last data filed at 01/20/2018 1100 Gross per 24 hour  Intake 2571.01 ml  Output 1350 ml  Net 1221.01 ml   Filed Weights   01/18/18 0500 01/19/18 0424 01/20/18 0355  Weight: 86.3 kg 86.5 kg 86.1 kg    Exam:  . General: 84 y.o. year-old male well-developed well-nourished in no acute distress.  Alert and interactive. . Cardiovascular: Regular rate and rhythm with no rubs or gallops.  No JVD or thyromegaly noted.Marland Kitchen   Marland Kitchen  Respiratory: Clear to all station with no wheezes or rales.  Good inspiratory effort.. . Abdomen: Soft nontender nondistended with normal bowel sounds x4 quadrants. . Musculoskeletal: Trace lower extremity edema. 2/4 pulses in all 4 extremities. . Skin: LLE with wound vac in place . Psychiatry: Mood is appropriate for condition and  setting   Data Reviewed: CBC: Recent Labs  Lab 01/15/18 1531  01/16/18 1937 01/17/18 0406 01/17/18 2035 01/18/18 0331 01/18/18 2042 01/20/18 0635  WBC 8.8   < > 5.0 4.0 3.9* 3.2*  --  3.1*  NEUTROABS 6.1  --  3.5  --   --   --   --   --   HGB 10.5*   < > 9.2* 8.2* 7.5* 7.2* 8.0* 8.6*  HCT 34.0*   < > 30.3* 26.8* 24.6* 23.7* 27.0* 28.5*  MCV 103.3*   < > 103.4* 104.3* 106.0* 104.4*  --  100.7*  PLT 147*   < > 100* 102* 99* 103*  --  102*   < > = values in this interval not displayed.   Basic Metabolic Panel: Recent Labs  Lab 01/15/18 1531 01/16/18 0051 01/17/18 1231 01/18/18 0331  NA 134* 135 139 140  K 4.3 4.2 4.8 5.1  CL 99 102 110 111  CO2 23 22 21* 20*  GLUCOSE 102* 115* 106* 131*  BUN 69* 63* 47* 44*  CREATININE 4.69* 3.76* 2.12* 1.93*  CALCIUM 9.1 8.3* 8.7* 8.6*   GFR: Estimated Creatinine Clearance: 28.5 mL/min (A) (by C-G formula based on SCr of 1.93 mg/dL (H)). Liver Function Tests: Recent Labs  Lab 01/15/18 1531  AST 36  [name] 13  ALKPHOS 46  BILITOT 1.7*  PROT 7.6  ALBUMIN 3.8   No results for input(s): LIPASE, AMYLASE in the last 168 hours. No results for input(s): AMMONIA in the last 168 hours. Coagulation Profile: Recent Labs  Lab 01/16/18 0051 01/17/18 0406 01/18/18 0331 01/19/18 0406 01/20/18 0635  INR 1.49 1.58 1.83 1.74 1.71   Cardiac Enzymes: Recent Labs  Lab 01/15/18 1531 01/16/18 0051 01/16/18 1909 01/17/18 1231  CKTOTAL  --  717* 456* 276  TROPONINI 0.09*  --   --   --    BNP (last 3 results) No results for input(s): PROBNP in the last 8760 hours. HbA1C: No results for input(s): HGBA1C in the last 72 hours. CBG: Recent Labs  Lab 01/15/18 2152 01/15/18 2335  GLUCAP 76 121*   Lipid Profile: No results for input(s): CHOL, HDL, LDLCALC, TRIG, CHOLHDL, LDLDIRECT in the last 72 hours. Thyroid Function Tests: No results for input(s): TSH, T4TOTAL, FREET4, T3FREE, THYROIDAB in the last 72 hours. Anemia Panel: No  results for input(s): VITAMINB12, FOLATE, FERRITIN, TIBC, IRON, RETICCTPCT in the last 72 hours. Urine analysis:    Component Value Date/Time   COLORURINE YELLOW 01/15/2018 2021   APPEARANCEUR CLEAR 01/15/2018 2021   LABSPEC 1.015 01/15/2018 2021   PHURINE 5.5 01/15/2018 2021   GLUCOSEU NEGATIVE 01/15/2018 2021   HGBUR MODERATE (A) 01/15/2018 2021   BILIRUBINUR NEGATIVE 01/15/2018 2021   KETONESUR 15 (A) 01/15/2018 2021   PROTEINUR NEGATIVE 01/15/2018 2021   NITRITE NEGATIVE 01/15/2018 2021   LEUKOCYTESUR NEGATIVE 01/15/2018 2021   Sepsis Labs: @LABRCNTIP (procalcitonin:4,lacticidven:4)  ) Recent Results (from the past 240 hour(s))  Culture, blood (Routine x 2)     Status: None (Preliminary result)   Collection Time: 01/15/18  3:34 PM  Result Value Ref Range Status   Specimen Description   Final    BLOOD BLOOD  LEFT FOREARM Performed at [facility], [address] [place], [place] [address]    Special Requests   Final    BOTTLES DRAWN AEROBIC AND ANAEROBIC Blood Culture results may not be optimal due to an inadequate volume of blood received in culture bottles Performed at [facility], [address] [place], [place] [address]    Culture   Final    NO GROWTH 4 DAYS Performed at [facility], [place] [address], [place], [place] [address]    Report Status PENDING  Incomplete  Culture, blood (Routine x 2)     Status: None (Preliminary result)   Collection Time: 01/15/18  3:44 PM  Result Value Ref Range Status   Specimen Description   Final    BLOOD RIGHT ANTECUBITAL Performed at [facility], [address] [place], [place] [address]    Special Requests   Final    BOTTLES DRAWN AEROBIC AND ANAEROBIC Blood Culture adequate volume Performed at [facility], [address] [place], [place] [address]    Culture   Final    NO GROWTH 4 DAYS Performed at [facility], [place] [address], [place], [place] [address]     Report Status PENDING  Incomplete  MRSA PCR Screening     Status: None   Collection Time: 01/15/18  9:49 PM  Result Value Ref Range Status   MRSA by PCR NEGATIVE NEGATIVE Final    Comment:        The GeneXpert MRSA Assay (FDA approved for NASAL specimens only), is one component of a comprehensive MRSA colonization surveillance program. It is not intended to diagnose MRSA infection nor to guide or monitor treatment for MRSA infections. Performed at [facility], [place] [address], [place], [place] [address]       Studies: No results found.  Scheduled Meds: . dorzolamide  1 drop Both Eyes BID  . latanoprost  1 drop Both Eyes QHS  . levothyroxine  100 mcg Oral Q0600  . metoprolol  25 mg Oral Daily  . polyethylene glycol  17 g Oral Daily  . senna  2 tablet Oral BID  . tamsulosin  0.4 mg Oral Daily  . warfarin  5 mg Oral ONCE-1800  . Warfarin - Pharmacist Dosing Inpatient   Does not apply q1800    Continuous Infusions: . sodium chloride Stopped (01/15/18 1928)  . sodium chloride 50 mL/hr at 01/20/18 0449  . heparin 1,500 Units/hr (01/20/18 0843)  . lactated ringers Stopped (01/17/18 2124)  . piperacillin-tazobactam (ZOSYN)  IV 3.375 g (01/20/18 0546)     LOS: 5 days     Kayleen Memos, MD [facility] Pager [phone]  If 7PM-7AM, please contact night-coverage [email or web address] Password [email or web address] 01/20/2018, 1:52 PM

## 2018-01-20 NOTE — Progress Notes (Signed)
Lateral wound vac malfunction. On assessment blood leaking from out of seal. Wound vac removed and changed to wet to dry dressing using 4x4 and ABD pads wrapped in kerlix until reevaluation in the morning. Will change prn. Will continue to monitor.

## 2018-01-20 NOTE — Progress Notes (Signed)
Pt unable to void. Bladder scan volume 500 mL. In and out cath done per protocol. 550 mL out. Urine yellow/clear no ordor. Pt tolerated well. Will continue to monitor.

## 2018-01-20 NOTE — Consult Note (Signed)
[place] Nurse wound consult note Consulted for [facility] therapy that is not obtaining a seal.  I spoke with Leontine Locket, PA-C and learned that they want the [facility] team to replace the Trac pad.  If this does not remedy the problem, the nurses can remove the VAC foam and place a saline moistened gauze into the wound until the physician team can see the patient tomorrow.  I have requested the unit secretary to order two Trac pads and a large VAC dressing kit for future use.  We are awaiting arrival of the supplies.  Val Riles, RN, MSN, CWOCN, CNS-BC, pager [phone]

## 2018-01-20 NOTE — Progress Notes (Signed)
Patient on heart healthy diet. No history of diabetes. Patient should have adequate nutrition for wound healing.

## 2018-01-20 NOTE — Progress Notes (Signed)
ANTICOAGULATION CONSULT NOTE - Follow Up Consult  Pharmacy Consult for Heparin/Coumadin Indication: mechanical mitral valve, afib  Allergies  Allergen Reactions  . Zantac [Ranitidine Hcl]     Unknown reaction    Patient Measurements: Height: 6' (182.9 cm) Weight: 189 lb 13.1 oz (86.1 kg) IBW/kg (Calculated) : 77.6  Vital Signs: Temp: 98.1 F (36.7 C) (12/12 0355) Temp Source: Oral (12/12 0355) BP: 151/95 (12/12 0800) Pulse Rate: 100 (12/12 0800)  Labs: Recent Labs    01/17/18 1231  01/17/18 2035 01/18/18 0331 01/18/18 2042 01/19/18 0406 01/19/18 2234 01/20/18 0635  HGB  --    < > 7.5* 7.2* 8.0*  --   --  8.6*  HCT  --    < > 24.6* 23.7* 27.0*  --   --  28.5*  PLT  --   --  99* 103*  --   --   --  102*  LABPROT  --   --   --  21.0*  --  20.1*  --  19.8*  INR  --   --   --  1.83  --  1.74  --  1.71  HEPARINUNFRC  --   --   --   --   --   --  0.24* 0.34  CREATININE 2.12*  --   --  1.93*  --   --   --   --   CKTOTAL 276  --   --   --   --   --   --   --    < > = values in this interval not displayed.    Estimated Creatinine Clearance: 28.5 mL/min (A) (by C-G formula based on SCr of 1.93 mg/dL (H)).  Assessment: 84 y/o M on Coumadin PTA for mechanical mitral valve + hx Afib. Coumadin has been held past 12/7 dose as pt for interventions for L calf hematoma. Heparin bridge started 12/7. Heparin off since 12/8 PM d/t oozing from line/hematoma. Resume heparin gtt with warfarin bridge 12/11 per vascular  INR this am 1.71, Hep Lvl 0.34  H&H 8.6/28.5, Plt  PTA warfarin 4 mg MWF, 2 mg AOD  Goal of Therapy:  INR goal 2.5-3.5 Heparin level 0.3-0.7 units/ml Monitor platelets by anticoagulation protocol: Yes   Plan:  Continue heparin 1500 units/hr Warfarin 5 mg x 1 Daily CBC/HL/INR  Levester Fresh, PharmD, BCPS, BCCCP Clinical Pharmacist [phone]  Please check AMION for all [place] numbers  01/20/2018 8:09 AM

## 2018-01-20 NOTE — Progress Notes (Signed)
Pt wound vac to left lower extremity reading blockage warning. Canister changed. All tubes and connections checked for clots/leaks. Incision site checked. New machine ordered and changed out. Dr. Donnetta Hutching paged and notified. No orders given but to be have wound vac revised in the morning. Wound vac still on continuous suction going at 100 with some drainage. Will continue to monitor.

## 2018-01-20 NOTE — Progress Notes (Signed)
PCCM Interval Note  Outpatient hospital follow-up visit made for Mr. Gahm with Dr. Lamonte Sakai to continue evaluation of his abnormal CT scan of the chest.  02/15/2018, 11:15 AM.   Call if we can assist while he is admitted.   Baltazar Apo, MD, PhD 01/20/2018, 3:57 PM [facility] [phone] or if no answer [phone]

## 2018-01-20 NOTE — Progress Notes (Signed)
Pt is unable to urinate. Bladder scan has urine retention 440 ml. Lydia,unit  incharge nurse got a verbal order with read back bye from Dr. [place] Crane for indwelling urinary catheter. He has tremendous amount of urine out put after that.  Wound vac is not completely function due to Pt has bleeding and keep clotting. Consult wound care team to evaluate, recommendation is on wound care note. MD aware. Will continue to monitor.  Kennyth Lose, RN

## 2018-01-21 LAB — BASIC METABOLIC PANEL
ANION GAP: 8 (ref 5–15)
BUN: 22 mg/dL (ref 8–23)
CALCIUM: 9.2 mg/dL (ref 8.9–10.3)
CO2: 25 mmol/L (ref 22–32)
Chloride: 109 mmol/L (ref 98–111)
Creatinine, Ser: 1.31 mg/dL — ABNORMAL HIGH (ref 0.61–1.24)
GFR calc Af Amer: 56 mL/min — ABNORMAL LOW (ref >60)
GFR, EST NON AFRICAN AMERICAN: 48 mL/min — AB (ref >60)
Glucose, Bld: 103 mg/dL — ABNORMAL HIGH (ref 70–99)
Potassium: 4.8 mmol/L (ref 3.5–5.1)
Sodium: 142 mmol/L (ref 135–145)

## 2018-01-21 LAB — HEMOGLOBIN AND HEMATOCRIT, BLOOD
HCT: 25.4 % — ABNORMAL LOW (ref 39.0–52.0)
Hemoglobin: 7.6 g/dL — ABNORMAL LOW (ref 13.0–17.0)

## 2018-01-21 LAB — CBC
HCT: 25.3 % — ABNORMAL LOW (ref 39.0–52.0)
Hemoglobin: 7.5 g/dL — ABNORMAL LOW (ref 13.0–17.0)
MCH: 30.2 pg (ref 26.0–34.0)
MCHC: 29.6 g/dL — ABNORMAL LOW (ref 30.0–36.0)
MCV: 102 fL — ABNORMAL HIGH (ref 80.0–100.0)
Platelets: 106 10*3/uL — ABNORMAL LOW (ref 150–400)
RBC: 2.48 MIL/uL — ABNORMAL LOW (ref 4.22–5.81)
RDW: 15.1 % (ref 11.5–15.5)
WBC: 2.6 10*3/uL — ABNORMAL LOW (ref 4.0–10.5)
nRBC: 0 % (ref 0.0–0.2)

## 2018-01-21 LAB — CULTURE, BLOOD (ROUTINE X 2)
Culture: NO GROWTH
Culture: NO GROWTH
Special Requests: ADEQUATE

## 2018-01-21 LAB — PREPARE RBC (CROSSMATCH)

## 2018-01-21 LAB — PROTIME-INR
INR: 2.24
Prothrombin Time: 24.5 seconds — ABNORMAL HIGH (ref 11.4–15.2)

## 2018-01-21 LAB — HEPARIN LEVEL (UNFRACTIONATED): Heparin Unfractionated: 0.41 IU/mL (ref 0.30–0.70)

## 2018-01-21 MED ORDER — TAMSULOSIN HCL 0.4 MG PO CAPS: 0.4000 mg | ORAL_CAPSULE | Freq: Every day | ORAL | 0 refills | Status: AC

## 2018-01-21 MED ORDER — PIPERACILLIN-TAZOBACTAM 3.375 G IVPB
3.3750 g | Freq: Three times a day (TID) | INTRAVENOUS | Status: DC
  Administered 2018-01-22 (×2): 3.375 g via INTRAVENOUS
  Filled 2018-01-21 (×4): qty 50

## 2018-01-21 MED ORDER — SODIUM CHLORIDE 0.9% IV SOLUTION: Freq: Once | INTRAVENOUS | Status: DC

## 2018-01-21 MED ORDER — WARFARIN SODIUM 4 MG PO TABS
4.0000 mg | ORAL_TABLET | Freq: Once | ORAL | Status: AC
  Administered 2018-01-21: 4 mg via ORAL
  Filled 2018-01-21 (×2): qty 1

## 2018-01-21 MED ORDER — METOPROLOL SUCCINATE ER 25 MG PO TB24: 25.0000 mg | ORAL_TABLET | Freq: Every day | ORAL | 0 refills | Status: AC

## 2018-01-21 NOTE — Progress Notes (Signed)
ANTICOAGULATION CONSULT NOTE - Follow Up Consult  Pharmacy Consult for Heparin/Coumadin Indication: mechanical mitral valve, afib  Allergies  Allergen Reactions  . Zantac [Ranitidine Hcl]     Unknown reaction    Patient Measurements: Height: 6' (182.9 cm) Weight: 185 lb 13.6 oz (84.3 kg) IBW/kg (Calculated) : 77.6  Vital Signs: Temp: 97.8 F (36.6 C) (12/13 0400) Temp Source: Oral (12/13 0400) BP: 117/72 (12/13 0400)  Labs: Recent Labs    01/19/18 0406 01/19/18 2234 01/20/18 0635 01/21/18 0414 01/21/18 0744  HGB  --   --  8.6* 7.5* 7.6*  HCT  --   --  28.5* 25.3* 25.4*  PLT  --   --  102* 106*  --   LABPROT 20.1*  --  19.8* 24.5*  --   INR 1.74  --  1.71 2.24  --   HEPARINUNFRC  --  0.24* 0.34 0.41  --   CREATININE  --   --  1.66* 1.31*  --     Estimated Creatinine Clearance: 42 mL/min (A) (by C-G formula based on SCr of 1.31 mg/dL (H)).  Assessment: 84 y/o M on Coumadin PTA for mechanical mitral valve + hx Afib. Coumadin has been held past 12/7 dose as pt for interventions for L calf hematoma. Heparin bridge started 12/7. Heparin off since 12/8 PM d/t oozing from line/hematoma. Resume heparin gtt with warfarin bridge 12/11 per vascular.  INR today has trended up but remains below goal at 2.24, heparin therapeutic. Hgb down now s/p pRBCs.  PTA warfarin 4 mg MWF, 2 mg AOD  Goal of Therapy:  INR goal 2.5-3.5 Heparin level 0.3-0.7 units/ml Monitor platelets by anticoagulation protocol: Yes   Plan:  -Warfarin 4mg  PO x1 tonight -Continue heparin 1500 units/hr - can likely stop 12/14 if INR >2.5 -Daily protime, anti-Xa level, and CBC  Arrie Senate, PharmD, BCPS Clinical Pharmacist [phone] Please check AMION for all [place] numbers 01/21/2018

## 2018-01-21 NOTE — Consult Note (Signed)
[place] Nurse wound consult note Reason for Consult: Consult requested to re-apply Vac dressings to left leg fasciotomy sites.  Vascular team following for assessment and plan of care and has assessed the wounds today; no further bleeding as was noted yesterday. Wound type: 2 full thickness post-op wounds to inner and outer left calf Measurement: inner wound; 10X3X.2cm  Outer wound 21X4X1cm Wound bed: beefy red Drainage (amount, consistency, odor) small amt bloody drainage Periwound: intact skin surrounding Dressing procedure/placement/frequency: Applied Mepitel over the wound beds to reduce discomfort and adherence of dressings, then applied one piece black foam over each wound and bridged together to 134mm cont suction. Pt tolerated with minimal discomfort.  WOC will plan to change Q M/W/F.  No family members at the bedside to discuss plan of care. Julien Girt MSN, RN, [place], [place], [place]

## 2018-01-21 NOTE — Care Management Note (Signed)
Case Management Note Marvetta Gibbons RN, BSN Transitions of Care Unit 4E- RN Case Manager [phone] Initial CM Note documented by Marvetta Gibbons RNCM Patient Details  Name: Ernest Sanchez MRN: [id number] Date of Birth: Jun 10, 1926  Subjective/Objective:   Pt admitted with hypotension, sepsis, spontaneous gastrocnemius hematoma- s/p 4 compartment fasciotomy with wound VAC placment               Action/Plan: PTA pt lived at home alone, has family nearby that assist as needed (daughter, granddaughter)- per MD notes pt may need home wound VAC for discharge- [facility] home wound vac form has been signed- orders placed for HHRN/PT/OT- spoke with pt/daughter and granddaughter at bedside- provided CMS list for [facility] agency- per daughter they would like time to look over agencies- they are concerned about pt's urinary retention and plan per urology, pt is also having issues with wound VAC so may go home with wet to dry drsg. Per daughter pt goes to the [facility] and she has contacted them and has been working with them for [employer] (started about 2 weeks ago) discussed using Medicare benefits for skilled [place] needs- family is also contemplating a request to have pt transferred to the [facility] hospital for continued care. Cm will f/u with family for transition plans.   Expected Discharge Date:                  Expected Discharge Plan:  [place]  In-House Referral:     Discharge planning Services  CM Consult  Post Acute Care Choice:  Durable Medical Equipment, Home Health Choice offered to:  Adult Children  DME Arranged:  Vac DME Agency:  [place] Arranged:  RN, PT, OT [facility] Agency:     Status of Service:  In process, will continue to follow  If discussed at Long Length of Stay Meetings, dates discussed:    Discharge Disposition:   Additional Comments: 01/21/18 @ 1414-Jafeth Mustin RNCM-Call received from [facility] transfer coordinator) inquiring if patient would  like to be considered for transfer to [facility]. Physician certification and patient consent for transfer form received via fax and discuss with patient/2 daughters. Patient/daughters would like to consent for transfer, with form signed by patient and witnessed by CM. Epic message sent to Dr. Nevada Crane requesting signature to initiate transfer process; awaiting response. CM team will continue to follow.  Midge Minium RN, BSN, NCM-BC, ACM-RN [phone] 01/21/2018, 2:14 PM

## 2018-01-21 NOTE — Care Management (Signed)
[id number] 01-21-18 Jacqlyn Krauss, RN,BSN  Covering Case Manager on [place]. Patient wishes to be transferred to [place] spoke with patient and he is agreeable to be transferred to [facility] Paperwork signed for transfer and witnessed. CM did call the Transfer Fees Coordinator Hassan Rowan @ [phone] ext [phone]. Fax [phone]. Information faxed to [facility]. Information will need to be reviewed by the [facility]  Physician. [address] may receive information tonight vs am in regards to transfer. CM did provide transfer coordinator # for Dr. Nevada Crane and to call the unit after 7:00 pm to find the attending physician on call. Hassan Rowan Psychologist, educational did state that [facility] would be responsible for transportation. Unit aware to contact [facility] @ [phone] if patient is approved tonight. Nurse is aware to print the face-sheet, fill out/print the medical necessity form and print the [facility] Transport Form, MD to fill out the EMTALA Form. Family was informed of time the information was faxed and that the Attending Physician has to look over information before saying transfer is appropriate. Family was appreciative of time spent. No further needs from this CM. Bethena Roys , RN BSN Case Manager [phone]

## 2018-01-21 NOTE — Progress Notes (Addendum)
Progress Note    01/21/2018 7:18 AM 4 Days Post-Op  Subjective:  Says he's having some pain.  Daughter upset about yesterday and how situation about the foley and follow up with urology was handled.   Afebrile HR 70's-90's  010'X-323'F systolic 57% [id number]  Vitals:   01/20/18 2000 01/21/18 0400  BP: 133/69 117/72  Pulse: 93   Resp: 20 16  Temp: 98 F (36.7 C) 97.8 F (36.6 C)  SpO2: 100% 98%    Physical Exam: General:  No distress Lungs:  Non labored Incisions:    Left medial leg wound    Left lateral leg wound   CBC    Component Value Date/Time   WBC 2.6 (L) 01/21/2018 0414   RBC 2.48 (L) 01/21/2018 0414   HGB 7.5 (L) 01/21/2018 0414   HGB 13.2 04/10/2009 1412   HCT 25.3 (L) 01/21/2018 0414   HCT 41.3 04/10/2009 1412   PLT 106 (L) 01/21/2018 0414   PLT 149 04/10/2009 1412   MCV 102.0 (H) 01/21/2018 0414   MCV 88.4 04/10/2009 1412   MCH 30.2 01/21/2018 0414   MCHC 29.6 (L) 01/21/2018 0414   RDW 15.1 01/21/2018 0414   RDW 16.1 (H) 04/10/2009 1412   LYMPHSABS 0.5 (L) 01/16/2018 1937   LYMPHSABS 1.0 04/10/2009 1412   MONOABS 0.9 01/16/2018 1937   MONOABS 0.8 04/10/2009 1412   EOSABS 0.1 01/16/2018 1937   EOSABS 0.2 04/10/2009 1412   BASOSABS 0.0 01/16/2018 1937   BASOSABS 0.0 04/10/2009 1412    BMET    Component Value Date/Time   NA 142 01/21/2018 0414   K 4.8 01/21/2018 0414   CL 109 01/21/2018 0414   CO2 25 01/21/2018 0414   GLUCOSE 103 (H) 01/21/2018 0414   BUN 22 01/21/2018 0414   CREATININE 1.31 (H) 01/21/2018 0414   CALCIUM 9.2 01/21/2018 0414   GFRNONAA 48 (L) 01/21/2018 0414   GFRAA 56 (L) 01/21/2018 0414    INR    Component Value Date/Time   INR 2.24 01/21/2018 0414     Intake/Output Summary (Last 24 hours) at 01/21/2018 0718 Last data filed at 01/21/2018 0520 Gross per 24 hour  Intake 1855.38 ml  Output 850 ml  Net 1005.38 ml     Assessment:  84 y.o. male is s/p:  4 compartment fasciotomy left lower extremity for  spontaneous gastrocnemius hematoma. Wound VAC change at bedside yesterday   4 Days Post-Op  Plan: -wound vac removed yesterday b/c it couldn't get a good seal and there was a bloody ooze.  Both medial and lateral wet to dry dressing removed this morning and wound beds look good.  There is minimal bloody ooze from the medial wound and none from the lateral wound.  May be able to get wound vac back on this afternoon. -discussed plan for wounds with daughter-may be able to close next week, but if not, he may need to go home with wound vac.  She expressed understanding.   -DVT prophylaxis:  Heparin to coumadin bridge.  -acute blood loss anemia-hgb down to 7.5 this am-two PRBC's ordered per primary team.  Creatinine also improved.  Labs in am. -pt had urinary retention yesterday and foley was placed.     Leontine Locket, PA-C [facility] [phone] 01/21/2018 7:18 AM   I have independently interviewed and examined patient and agree with PA assessment and plan above.  Wound VAC is currently to suction.  May need to be destination wound VAC therapy.  If  he is still here Monday I will evaluate for possible closure on Tuesday.  Otherwise I will set him up for an outpatient visit in a few weeks.  If the [facility] is to handle this then that is okay to is at this point it is surgical wounds medially and laterally on the leg that are clean and could either be closed or closed secondarily with the help of wound VAC therapy.  Aaren Atallah C. Donzetta Matters, MD [facility] [place] Office: [phone] Pager: [phone]

## 2018-01-21 NOTE — Discharge Instructions (Signed)
Cellulitis, Adult Cellulitis is a skin infection. The infected area is usually red and sore. This condition occurs most often in the arms and lower legs. It is very important to get treated for this condition. Follow these instructions at home:  Take over-the-counter and prescription medicines only as told by your doctor.  If you were prescribed an antibiotic medicine, take it as told by your doctor. Do not stop taking the antibiotic even if you start to feel better.  Drink enough fluid to keep your pee (urine) clear or pale yellow.  Do not touch or rub the infected area.  Raise (elevate) the infected area above the level of your heart while you are sitting or lying down.  Place warm or cold wet cloths (warm or cold compresses) on the infected area. Do this as told by your doctor.  Keep all follow-up visits as told by your doctor. This is important. These visits let your doctor make sure your infection is not getting worse. Contact a doctor if:  You have a fever.  Your symptoms do not get better after 1-2 days of treatment.  Your bone or joint under the infected area starts to hurt after the skin has healed.  Your infection comes back. This can happen in the same area or another area.  You have a swollen bump in the infected area.  You have new symptoms.  You feel ill and also have muscle aches and pains. Get help right away if:  Your symptoms get worse.  You feel very sleepy.  You throw up (vomit) or have watery poop (diarrhea) for a long time.  There are red streaks coming from the infected area.  Your red area gets larger.  Your red area turns darker. This information is not intended to replace advice given to you by your health care provider. Make sure you discuss any questions you have with your health care provider. Document Released: 07/15/2007 Document Revised: 07/04/2015 Document Reviewed: 12/05/2014 Elsevier Interactive Patient Education  2018 [employer].   Acute Kidney Injury, Adult Acute kidney injury is a sudden worsening of kidney function. The kidneys are organs that have several jobs. They filter the blood to remove waste products and extra fluid. They also maintain a healthy balance of minerals and hormones in the body, which helps control blood pressure and keep bones strong. With this condition, your kidneys do not do their jobs as well as they should. This condition ranges from mild to severe. Over time it may develop into long-lasting (chronic) kidney disease. Early detection and treatment may prevent acute kidney injury from developing into a chronic condition. What are the causes? Common causes of this condition include:  A problem with blood flow to the kidneys. This may be caused by: ? Low blood pressure (hypotension) or shock. ? Blood loss. ? Heart and blood vessel (cardiovascular) disease. ? Severe burns. ? Liver disease.  Direct damage to the kidneys. This may be caused by: ? Certain medicines. ? A kidney infection. ? Poisoning. ? Being around or in contact with toxic substances. ? A surgical wound. ? A hard, direct hit to the kidney area.  A sudden blockage of urine flow. This may be caused by: ? Cancer. ? Kidney stones. ? An enlarged prostate in males.  What are the signs or symptoms? Symptoms of this condition may not be obvious until the condition becomes severe. Symptoms of this condition can include:  Tiredness (lethargy), or difficulty staying awake.  Nausea or vomiting.  Swelling (edema) of the face, legs, ankles, or feet.  Problems with urination, such as: ? Abdominal pain, or pain along the side of your stomach (flank). ? Decreased urine production. ? Decrease in the force of urine flow.  Muscle twitches and cramps, especially in the legs.  Confusion or trouble concentrating.  Loss of appetite.  Fever.  How is this diagnosed? This condition may be diagnosed with tests,  including:  Blood tests.  Urine tests.  Imaging tests.  A test in which a sample of tissue is removed from the kidneys to be examined under a microscope (kidney biopsy).  How is this treated? Treatment for this condition depends on the cause and how severe the condition is. In mild cases, treatment may not be needed. The kidneys may heal on their own. In more severe cases, treatment will involve:  Treating the cause of the kidney injury. This may involve changing any medicines you are taking or adjusting your dosage.  Fluids. You may need specialized IV fluids to balance your body's needs.  Having a catheter placed to drain urine and prevent blockages.  Preventing problems from occurring. This may mean avoiding certain medicines or procedures that can cause further injury to the kidneys.  In some cases treatment may also require:  A procedure to remove toxic wastes from the body (dialysis or continuous renal replacement therapy - CRRT).  Surgery. This may be done to repair a torn kidney, or to remove the blockage from the urinary system.  Follow these instructions at home: Medicines  Take over-the-counter and prescription medicines only as told by your health care provider.  Do not take any new medicines without your health care provider's approval. Many medicines can worsen your kidney damage.  Do not take any vitamin and mineral supplements without your health care provider's approval. Many nutritional supplements can worsen your kidney damage. Lifestyle  If your health care provider prescribed changes to your diet, follow them. You may need to decrease the amount of protein you eat.  Achieve and maintain a healthy weight. If you need help with this, ask your health care provider.  Start or continue an exercise plan. Try to exercise at least 30 minutes a day, 5 days a week.  Do not use any tobacco products, such as cigarettes, chewing tobacco, and e-cigarettes. If you  need help quitting, ask your health care provider. General instructions  Keep track of your blood pressure. Report changes in your blood pressure as told by your health care provider.  Stay up to date with immunizations. Ask your health care provider which immunizations you need.  Keep all follow-up visits as told by your health care provider. This is important. Where to find more information:  American Association of Kidney Patients: BombTimer.gl  National Kidney Foundation: www.kidney.[place]: https://mathis.com/  Life Options Rehabilitation Program: ? www.lifeoptions.org ? www.kidneyschool.org Contact a health care provider if:  Your symptoms get worse.  You develop new symptoms. Get help right away if:  You develop symptoms of worsening kidney disease, which include: ? Headaches. ? Abnormally dark or light skin. ? Easy bruising. ? Frequent hiccups. ? Chest pain. ? Shortness of breath. ? End of menstruation in women. ? Seizures. ? Confusion or altered mental status. ? Abdominal or back pain. ? Itchiness.  You have a fever.  Your body is producing less urine.  You have pain or bleeding when you urinate. Summary  Acute kidney injury is a sudden worsening of kidney function.  Acute kidney injury can be caused by problems with blood flow to the kidneys, direct damage to the kidneys, and sudden blockage of urine flow.  Symptoms of this condition may not be obvious until it becomes severe. Symptoms may include edema, lethargy, confusion, nausea or vomiting, and problems passing urine.  This condition can usually be diagnosed with blood tests, urine tests, and imaging tests. Sometimes a kidney biopsy is done to diagnose this condition.  Treatment for this condition often involves treating the underlying cause. It is treated with fluids, medicines, dialysis, diet changes, or surgery. This information is not intended to replace advice given to you by your  health care provider. Make sure you discuss any questions you have with your health care provider. Document Released: 08/11/2010 Document Revised: 05/28/2016 Document Reviewed: 01/17/2016 Elsevier Interactive Patient Education  [employer].

## 2018-01-21 NOTE — Discharge Summary (Signed)
Discharge Summary  Ernest Sanchez TSV:[id number] DOB: 1926-09-05  PCP: Leanna Battles, MD  Admit date: 01/15/2018 Discharge date: 01/21/2018  Time spent: 35 minutes  Recommendations for Outpatient Follow-up:  1. Transfer to the [facility] at family request (Daughter)  Discharge Diagnoses:  Active Hospital Problems   Diagnosis Date Noted  . Pulmonary nodule   . Hematoma of left lower extremity   . Hypotension due to hypovolemia 01/15/2018  . Cellulitis 01/15/2018  . Severe sepsis [facility])     Resolved Hospital Problems  No resolved problems to display.    Discharge Condition: Stable   Vitals:   01/21/18 1654 01/21/18 1832  BP: 134/80   Pulse: 80 73  Resp: (!) 23 20  Temp: 98 F (36.7 C)   SpO2: 100% 97%    History of present illness:  84 year old male with hypothyroidism hypertension on chronic anticoagulation for mitral valve coming in with left lower limb swelling redness suggestive of cellulitis and hypotension second sepsis. Deemed to have hematoma.  No evidence of DVT.  POD #4 post wound VAC placement after 4 compartment fasciotomies.  01/20/2018: Patient seen and examined at his bedside.  Wound VAC noted this morning not to obtain a seal.  Wound care RN consulted and replaced the track pad for VAC.  Patient has no new complaints.  Does not appear to be in distress.   Urinary retention reported by RN this a.m.  Called and spoke with Dr. Junious Silk urology who recommended keeping Foley cath for 5 days and follow-up in his office outpatient.  Patient's daughter expresses discontent with this plan and requests transfer to the [facility].   On 01/21/2018: Patient was seen and examined at his bedside with his daughter and charge nurse present. No acute distress.  Hemoglobin low this morning.  Will be transfused 2 unit PRBCs.  Transfer to [facility] has been initiated today around 1500 as requested by the patient's daughter for reasons stated  above.   SIGNIFICANT EVENTS:  12/7 admit for LLE swelling  12/8 found to have L gastrocnemius hematoma. Vascular surgery consulted.  12/9 patient and family have opted for surgery  STUDIES:   Ultrasound Doppler>> no evidence DVT CT LLE 12/8 > Generalized soft tissue edema in the subcutaneous fat circumferentially around the left lower leg as can be seen with cellulitis versus venous insufficiency. 7.3 x 4.2 x 17 cm intramuscular hematoma in the medial gastrocnemius muscle. No soft tissue emphysema to suggest necrotizing infection. Small hemarthrosis. CT chest 12/8 > 4.4 cm posterior left lower lobe mass, compatible primary bronchogenic neoplasm. Small mediastinal nodes, suspicious for nodal metastases. Hilar regions are suboptimally evaluated in the absence of intravenous Contrast. Associated small left pleural effusion, possibly malignant. 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  CULTURES:  Blood cultures>>  no growth in 5 days  ANTIBIOTICS:  Zosyn>> 12/7 >>> Vancomycin>>12/7 >12/9  LINES/TUBES:  Peripheral IVs   Hospital Course:  Active Problems:   Hypotension due to hypovolemia   Cellulitis   Severe sepsis (HCC)   Hematoma of left lower extremity   Pulmonary nodule  Left gastrocnemius hematoma POD #4 post wound VAC placement after 4 compartment fasciotomies Wound VAC placement on 01/17/2018 Wound VAC replacement 01/19/2018 Track pads replaced on 01/20/2018 Vascular surgery followed  Acute left lower limb cellulitis with concomitant peripheral arterial disease Presented with T-max of 100.7, WBC 3.2 Continue antibiotics IV Zosyn  Newly diagnosed urinary retention Curb sided with urology Dr. Junious Silk who recommended  keeping Foley cath for 5 days and following up outpatient in his office after discharge for voiding trial Continue Flomax 0.4 mg daily  Mitral valve replacement on chronic anticoagulation INR 2.24 currently on  Coumadin and being bridged with heparin drip Pharmacy managing Repeat INR in the morning Goal INR between 2.5 and 3.5  AKI on CKD3, improving Back to baseline creatinine of 1.3 and GFR 48  Presented with creatinine of 4.6  Avoid nephrotoxic agents/dehydration/hypotension Renal ultrasound done on 01/09/2018 unremarkable.  No hydronephrosis. Repeat BMP  Acute blood loss anemia Presented with hemoglobin of 7 Post 1 unit PRBC transfusion Hemoglobin dropped from 8.6-7.5 2 units of PRBCs ordered to be transfused Maintain hemoglobin greater than 8.0  Chronic systolic CHF Last 2D echo done on 01/09/2018 revealed LVEF 30 to 35% with diffuse hypokinesis with relative.  Apical akinesis Continue cardiac medications Continue strict I's and O's and daily weight   Hypothyroidism Continue levothyroxine  Recent diagnosis of 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 under investigation CT chest done on 01/16/2018 confirms it Follow-up with pulmonology Dr. Lamonte Sakai outpatient  Emphysema Smoking history Continue duo nebs PRN    DVT PROPHYLAXIS:  Coumadin bridged with heparin drip NUTRITION:Cardiac diet GOALS OF CARE:Full code FAMILY DISCUSSIONS: Updated his daughter at bedside. Consult: Vascular surgery, pulmonology, curb sided with urology   Discharge Exam: BP 134/80 (BP Location: Right Arm)   Pulse 73   Temp 98 F (36.7 C) (Oral)   Resp 20   Ht 6' (1.829 m)   Wt 84.3 kg   SpO2 97%   BMI 25.21 kg/m  . General: 84 y.o. year-old male well developed well nourished in no acute distress.  Alert and oriented x3. . Cardiovascular: Regular rate and rhythm with no rubs or gallops.  No thyromegaly or JVD noted.   Marland Kitchen Respiratory: Clear to auscultation with no wheezes or rales. Good inspiratory effort. . Abdomen: Soft nontender nondistended with normal bowel sounds x4 quadrants. . Musculoskeletal: No lower extremity edema. 2/4 pulses in all 4  extremities. Marland Kitchen Psychiatry: Mood is appropriate for condition and setting  Discharge Instructions You were cared for by a hospitalist during your hospital stay. If you have any questions about your discharge medications or the care you received while you were in the hospital after you are discharged, you can call the unit and asked to speak with the hospitalist on call if the hospitalist that took care of you is not available. Once you are discharged, your primary care physician will handle any further medical issues. Please note that NO REFILLS for any discharge medications will be authorized once you are discharged, as it is imperative that you return to your primary care physician (or establish a relationship with a primary care physician if you do not have one) for your aftercare needs so that they can reassess your need for medications and monitor your lab values.   Allergies as of 01/21/2018      Reactions   Zantac [ranitidine Hcl]    Unknown reaction      Medication List    STOP taking these medications   diclofenac sodium 1 % Gel Commonly known as:  VOLTAREN   enoxaparin 120 MG/0.8ML injection Commonly known as:  LOVENOX   furosemide 40 MG tablet Commonly known as:  LASIX   lisinopril 40 MG tablet Commonly known as:  PRINIVIL,ZESTRIL   polyethylene glycol powder powder Commonly known as:  GLYCOLAX/MIRALAX     TAKE these medications   albuterol  108 (90 Base) MCG/ACT inhaler Commonly known as:  PROVENTIL HFA;VENTOLIN HFA Inhale into the lungs every 6 (six) hours as needed for wheezing or shortness of breath.   citalopram 40 MG tablet Commonly known as:  CELEXA Take 20 mg by mouth daily.   dorzolamide 2 % ophthalmic solution Commonly known as:  TRUSOPT Place 1 drop into both eyes 2 (two) times daily.   gabapentin 300 MG capsule Commonly known as:  NEURONTIN Take 600 mg by mouth 2 (two) times daily.   levothyroxine 100 MCG tablet Commonly known as:  SYNTHROID,  LEVOTHROID Take 100 mcg by mouth daily before breakfast.   metoprolol succinate 25 MG 24 hr tablet Commonly known as:  TOPROL-XL Take 1 tablet (25 mg total) by mouth daily. Start taking on:  January 22, 2018 What changed:    medication strength  how much to take   mirtazapine 15 MG tablet Commonly known as:  REMERON Take 15 mg by mouth at bedtime.   tamsulosin 0.4 MG Caps capsule Commonly known as:  FLOMAX Take 1 capsule (0.4 mg total) by mouth daily. Start taking on:  January 22, 2018   temazepam 7.5 MG capsule Commonly known as:  RESTORIL Take 7.5 mg by mouth at bedtime.   warfarin 4 MG tablet Commonly known as:  COUMADIN Take 2-4 mg by mouth See admin instructions. Take 4mg  by mouth on MON WED FRI and 2mg  on all other days.      Allergies  Allergen Reactions  . Zantac [Ranitidine Hcl]     Unknown reaction   Follow-up Information    Collene Gobble, MD Follow up on 02/15/2018.   Specialty:  Pulmonary Disease Why:  11:15am  Contact information: [place] [address] [phone]        Leanna Battles, MD. Call in 1 day(s).   Specialty:  Internal Medicine Why:  Please call for post hospital follow-up appointment. Contact information: [address] [place] [address] [phone]            The results of significant diagnostics from this hospitalization (including imaging, microbiology, ancillary and laboratory) are listed below for reference.    Significant Diagnostic Studies: Dg Chest 2 View  Result Date: 01/08/2018 CLINICAL DATA:  Intermittent LEFT lower extremity swelling with chest pain. History of pneumonia and chronic interstitial lung disease. EXAM: CHEST - 2 VIEW COMPARISON:  None. FINDINGS: Elevated LEFT hemidiaphragm. Cardiac silhouette is mild-to-moderately enlarged. Pulmonary vascular congestion. Fullness of bilateral hilar with interstitial prominence. No pleural effusion or focal consolidation. LEFT  AICD with lead tips projecting RIGHT atrium and bilateral ventricles. Status post CABG. Calcified aortic arch. Biapical pleural thickening. No pneumothorax.Age indeterminate LEFT rib fractures. RIGHT ninth rib lucency. IMPRESSION: Cardiomegaly and vascular congestion. Age indeterminate interstitial prominence. Fullness of the hila could reflect vascular shadows though lymphadenopathy is possible. Recommend non emergent CT chest with contrast. Age indeterminate LEFT rib fractures. Possible lytic lesion RIGHT rib versus projectional artifact. Electronically Signed   By: Elon Alas M.D.   On: 01/08/2018 19:31   Ct Chest Wo Contrast  Result Date: 01/16/2018 CLINICAL DATA:  Lung mass on CT runoff EXAM: CT CHEST WITHOUT CONTRAST TECHNIQUE: Multidetector CT imaging of the chest was performed following the standard protocol without IV contrast. COMPARISON:  Partial comparison to CTA abdomen/pelvis/runoff dated 01/09/2018 FINDINGS: Cardiovascular: Cardiomegaly with left atrial enlargement. No pericardial effusion. Hyperdense blood pool relative to myocardium, suggesting anemia. Left subclavian ICD. No evidence of thoracic aortic aneurysm. Atherosclerotic  calcifications of the aortic arch. Three vessel coronary atherosclerosis. Mediastinum/Nodes: Small mediastinal lymph nodes, including an 11 mm short axis low right paratracheal node (series 4/image 47) and a 13 mm short axis subcarinal node (series 4/image 64). Hilar regions are suboptimally evaluated in the absence of intravenous contrast. Visualized thyroid is unremarkable. Lungs/Pleura: 2.8 x 3.4 x 4.4 cm posterior left lower lobe mass (series 4/image 86), compatible with primary bronchogenic neoplasm. Associated small loculated left pleural effusion, possibly malignant. Mild patchy/ground-glass opacity in the posterior right upper lobe, measuring 3.0 x 1.8 cm (series 8/image 47), nonspecific. Mild infection could have this appearance. Biapical  pleural-parenchymal scarring. No pneumothorax. Eventration of the left hemidiaphragm. Upper Abdomen: Visualized upper abdomen is better evaluated on recent CTA, with suspected multifocal hepatic metastases. Musculoskeletal: Exaggerated midthoracic kyphosis. Median sternotomy. IMPRESSION: 4.4 cm posterior left lower lobe mass, compatible primary bronchogenic neoplasm. Small mediastinal nodes, suspicious for nodal metastases. Hilar regions are suboptimally evaluated in the absence of intravenous contrast. Associated small left pleural effusion, possibly malignant. 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 Aortic Atherosclerosis (ICD10-I70.0). Electronically Signed   By: Julian Hy M.D.   On: 01/16/2018 20:02   Ct Angio Aortobifemoral W And/or Wo Contrast  Addendum Date: 01/09/2018   ADDENDUM REPORT: 01/09/2018 04:30 ADDENDUM: Findings discussed with Dr Marlowe Sax at 04:20 am Electronically Signed   By: Keith Rake M.D.   On: 01/09/2018 04:30   Addendum Date: 01/09/2018   ADDENDUM REPORT: 01/09/2018 04:05 ADDENDUM: Findings conveyed to patient's nurse Lillia Dallas at 04:04 am Electronically Signed   By: Keith Rake M.D.   On: 01/09/2018 04:05   Result Date: 01/09/2018 CLINICAL DATA:  Left lower extremity edema. Cool right foot and ankle. Nonpalpable right foot pulses. EXAM: CT ANGIOGRAPHY OF ABDOMINAL AORTA WITH ILIOFEMORAL RUNOFF TECHNIQUE: Multidetector CT imaging of the abdomen, pelvis and lower extremities was performed using the standard protocol during bolus administration of intravenous contrast. Multiplanar CT image reconstructions and MIPs were obtained to evaluate the vascular anatomy. CONTRAST:  28mL ISOVUE-370 IOPAMIDOL (ISOVUE-370) INJECTION 76% COMPARISON:  None. FINDINGS: VASCULAR Aorta: Aortic atherosclerosis without aneurysm, dissection, vasculitis or significant stenosis. Celiac: Patent without evidence of aneurysm, dissection, vasculitis or  significant stenosis. Mild plaque at the origin. SMA: Patent without evidence of aneurysm, dissection, vasculitis or significant stenosis. Mild plaque at the origin. Renals: Both renal arteries are patent without evidence of aneurysm, dissection, vasculitis, fibromuscular dysplasia or significant stenosis. Mild plaque at the origin. IMA: Patent. RIGHT Lower Extremity Inflow: Common, internal and external iliac arteries are patent without evidence of aneurysm, dissection, vasculitis or significant stenosis. Mild to moderate calcified plaque throughout. Outflow: The common, superficial and profunda femoral arteries are patent. There is generalized diminished flow throughout the SFA which remains normal in caliber. No flow in the popliteal artery. Runoff: No visualized flow in the calf arteries. LEFT Lower Extremity Inflow: Common, internal and external iliac arteries are patent without evidence of aneurysm, dissection, vasculitis or significant stenosis. Outflow: Common, superficial and profunda femoral arteries and the popliteal artery are patent without evidence of aneurysm, dissection, vasculitis or significant stenosis. There is perivascular stranding about the popliteal vein. Runoff: Patent three vessel runoff to the ankle. Veins: Venous structures not well assessed given phase of contrast. Scoliotic curvature and multilevel degenerative change throughout the thoracic spine. Perivascular in intramuscular stranding about the left popliteal vein. Examination not tailored for venous assessment. Review of the MIP images confirms the above findings. NON-VASCULAR Lower chest: Peripheral 4.6 x 3.1 x  3.0 cm spiculated lung mass in the left lower lobe abuts the pleura. Nonspecific ground-glass opacity in the right upper lobe, partially included. Elevated left hemidiaphragm. Pacemaker partially included. Multi chamber cardiomegaly. Calcified bilateral hilar and mediastinal lymph nodes. Hepatobiliary: Multiple small  low-density lesions throughout the liver, incompletely assessed on arterial phase exam. Gallbladder physiologically distended, no calcified stone. No biliary dilatation. Pancreas: Parenchymal atrophy. No ductal dilatation or inflammation. Spleen: Normal in size and arterial phase enhancement. Adrenals/Urinary Tract: No adrenal nodule. No hydronephrosis. Minimal symmetric perinephric edema. Urinary bladder is physiologically distended. Stomach/Bowel: Stomach is nondistended. Duodenal diverticulum. No bowel wall thickening, inflammatory change or obstruction. Distal colonic diverticulosis without diverticulitis. Lymphatic: No enlarged abdominopelvic lymph nodes. Reproductive: Prominent prostate gland spans 5.7 cm. Other: No free fluid. Musculoskeletal: There is intramuscular stranding and heterogeneous enlargement about the posterior muscle compartment in the calf, possible intramuscular hematoma. Stranding also involves the posterior thigh muscle compartment, extending along the popliteal vein. Left knee joint effusion. Diffuse subcutaneous edema about the left calf and distal lower extremity. IMPRESSION: VASCULAR 1. Right lower extremity arterial occlusion from the popliteal artery distally. Mild to moderate atherosclerosis of the proximal right leg vasculature without additional sites of stenosis. 2. Diffuse aortic and peripheral vessel atherosclerosis without additional site of flow-limiting stenosis or acute arterial findings. 3. Intramuscular stranding in the left lower extremity including stranding about the popliteal vein. Venous structures cannot be assessed on the current exam given phase of contrast. Recommend correlation with left lower extremity duplex to evaluate for DVT. NON-VASCULAR 1. Left lower lobe pulmonary masslike opacity measures 4.6 x 3.1 cm, primary bronchogenic malignancy until proven otherwise. 2. Nonspecific ground-glass opacity in the right upper lobe of the lung. 3. Multiple hepatic  hypodensities which are suspicious for metastatic disease in the setting of suspected pulmonary malignancy. 4. Heterogeneous stranding and enlargement of the distal posterior left thigh muscles as well as posterior compartment of the calf. Given elevated INR, intramuscular hematoma is favored. I am awaiting a call back from the referring clinician. Electronically Signed: By: Keith Rake M.D. On: 01/09/2018 03:24   US Renal  Result Date: 01/09/2018 CLINICAL DATA:  Initial evaluation for elevated creatinine. EXAM: RENAL / URINARY TRACT ULTRASOUND COMPLETE COMPARISON:  Prior CT from earlier same day. FINDINGS: Right Kidney: Renal measurements: 9.5 x 4.6 x 3.9 cm = volume: 87.1 mL . Echogenicity within normal limits. No mass or hydronephrosis visualized. Left Kidney: Renal measurements: 10.0 x 5.8 x 4.6 cm = volume: 137.9 mL. Echogenicity within normal limits. No mass or hydronephrosis visualized. Bladder: Appears normal for degree of bladder distention. Bilateral ureteral jets visualized. IMPRESSION: Negative renal ultrasound.  No hydronephrosis. Electronically Signed   By: Jeannine Boga M.D.   On: 01/09/2018 04:37   Dg Chest Port 1 View  Result Date: 01/15/2018 CLINICAL DATA:  Confusion.  Sepsis. EXAM: PORTABLE CHEST 1 VIEW COMPARISON:  01/08/2018 FINDINGS: The heart is enlarged but stable. Stable pacer wires/AICD. Stable surgical changes from mitral valve replacement surgery. Mild tortuosity and calcification of the thoracic aorta. Marked elevation of the left hemidiaphragm with overlying vascular crowding, atelectasis and scarring. Stable biapical pleural and parenchymal scarring. No definite acute superimposed pulmonary process. Healed bilateral rib fractures. IMPRESSION: Stable cardiac enlargement. Chronic emphysematous and bronchitic type lung changes with dense biapical pleural and parenchymal scarring. No definite acute overlying pulmonary process. Electronically Signed   By: Marijo Sanes  M.D.   On: 01/15/2018 16:22   Vas [place] Burnard Bunting With/wo Tbi  Result Date: 01/09/2018 LOWER  EXTREMITY DOPPLER STUDY Indications: Right distal popliteal artery occlusion by CTA. High Risk Factors: Hypertension, hyperlipidemia.  Comparison Study: No prior study on file Performing Technologist: Sharion Dove RVS  Examination Guidelines: A complete evaluation includes at minimum, Doppler waveform signals and systolic blood pressure reading at the level of bilateral brachial, anterior tibial, and posterior tibial arteries, when vessel segments are accessible. Bilateral testing is considered an integral part of a complete examination. Photoelectric Plethysmograph (PPG) waveforms and toe systolic pressure readings are included as required and additional duplex testing as needed. Limited examinations for reoccurring indications may be performed as noted.  ABI Findings: +---------+------------------+-----+---------+--------+ Right    Rt Pressure (mmHg)IndexWaveform Comment  +---------+------------------+-----+---------+--------+ Brachial 134                    triphasic         +---------+------------------+-----+---------+--------+ PTA      197               1.41 triphasic         +---------+------------------+-----+---------+--------+ DP       172               1.23 triphasic         +---------+------------------+-----+---------+--------+ Great Toe123               0.88                   +---------+------------------+-----+---------+--------+ +---------+------------------+-----+---------+-------+ Left     Lt Pressure (mmHg)IndexWaveform Comment +---------+------------------+-----+---------+-------+ Brachial 140                    triphasic        +---------+------------------+-----+---------+-------+ PTA      175               1.25 triphasic        +---------+------------------+-----+---------+-------+ DP       157               1.12 triphasic         +---------+------------------+-----+---------+-------+ Gardiner Rhyme               0.84                  +---------+------------------+-----+---------+-------+ +-------+-----------+-----------+------------+------------+ ABI/TBIToday's ABIToday's TBIPrevious ABIPrevious TBI +-------+-----------+-----------+------------+------------+ Right  1.41       0.88                                +-------+-----------+-----------+------------+------------+ Left   1.25       0.84                                +-------+-----------+-----------+------------+------------+  Summary: Right: Resting right ankle-brachial index indicates noncompressible right lower extremity arteries.The right toe-brachial index is normal. Left: Resting left ankle-brachial index is within normal range. No evidence of significant left lower extremity arterial disease. The left toe-brachial index is normal.  *See table(s) above for measurements and observations.  Electronically signed by Servando Snare MD on 01/09/2018 at 8:55:47 PM.    Final    Ct Extremity Lower Left Wo Contrast  Result Date: 01/16/2018 CLINICAL DATA:  Left lower extremity cellulitis. Evaluate for necrotizing fasciitis. EXAM: CT OF THE LOWER LEFT EXTREMITY WITHOUT CONTRAST TECHNIQUE: Multidetector CT imaging of the lower left extremity was performed according to the standard protocol. COMPARISON:  None. FINDINGS:  Bones/Joint/Cartilage No fracture or dislocation. Normal alignment. High-density joint effusion concerning for hemarthrosis. Mild medial femorotibial compartment joint space narrowing. Mild lateral patellofemoral compartment joint space narrowing. No periosteal reaction or bone destruction. No aggressive osseous lesion. Mild osteoarthritis of the subtalar joints. Ligaments Ligaments are suboptimally evaluated by CT. Muscles and Tendons Flexor, extensor, peroneal and Achilles tendons are grossly intact. Patellar tendon and quadriceps tendon are grossly  intact. 7.3 x 4.2 x 17 cm heterogeneous hyperdense fluid collection in the medial gastrocnemius muscle most consistent with an intramuscular hematoma. Mild generalized muscle atrophy. Soft tissue No fluid collection or hematoma. No soft tissue mass. Soft tissue edema circumferentially in the subcutaneous fat around left lower leg. No soft tissue emphysema. No radiopaque foreign body. Peripheral vascular atherosclerotic disease. IMPRESSION: 1. Generalized soft tissue edema in the subcutaneous fat circumferentially around the left lower leg as can be seen with cellulitis versus venous insufficiency. 2. 7.3 x 4.2 x 17 cm intramuscular hematoma in the medial gastrocnemius muscle. 3. No soft tissue emphysema to suggest necrotizing infection. 4. Small hemarthrosis. Electronically Signed   By: Kathreen Devoid   On: 01/16/2018 17:01   Vas [place] Lower Extremity Venous (dvt)  Result Date: 01/17/2018  Lower Venous Study Indications: Edema. Other Indications: Large hematoma noted by CT. Limitations: Edema. Comparison Study: Prior negative study done 01/09/18 Performing Technologist: Sharion Dove RVS  Examination Guidelines: A complete evaluation includes B-mode imaging, spectral Doppler, color Doppler, and power Doppler as needed of all accessible portions of each vessel. Bilateral testing is considered an integral part of a complete examination. Limited examinations for reoccurring indications may be performed as noted.  Right Venous Findings: +---+---------------+---------+-----------+----------+-------+    CompressibilityPhasicitySpontaneityPropertiesSummary +---+---------------+---------+-----------+----------+-------+ CFVFull           Yes      Yes                          +---+---------------+---------+-----------+----------+-------+  Left Venous Findings: +---------+---------------+---------+-----------+----------+----------+          CompressibilityPhasicitySpontaneityPropertiesSummary     +---------+---------------+---------+-----------+----------+----------+ CFV      Full           Yes      Yes                             +---------+---------------+---------+-----------+----------+----------+ SFJ      Full                                                    +---------+---------------+---------+-----------+----------+----------+ FV Prox  Full                                                    +---------+---------------+---------+-----------+----------+----------+ FV Mid   Full                                                    +---------+---------------+---------+-----------+----------+----------+ FV DistalFull                                                    +---------+---------------+---------+-----------+----------+----------+  PFV      Full                                                    +---------+---------------+---------+-----------+----------+----------+ POP      Full           Yes      Yes                             +---------+---------------+---------+-----------+----------+----------+ PTV                                                   visualized +---------+---------------+---------+-----------+----------+----------+ Large hematoma noted mid calf to popliteal fossa  Left Technical Findings: Not visualized segments include peroneal.   Summary: Right: No evidence of common femoral vein obstruction. Left: There is no evidence of deep vein thrombosis in the lower extremity.  *See table(s) above for measurements and observations. Electronically signed by Servando Snare MD on 01/17/2018 at 4:54:48 PM.    Final    Vas [place] Lower Extremity Venous (dvt)  Result Date: 01/09/2018  Lower Venous Study Indications: Left leg/knee pain X 2 weeks.  Performing Technologist: Sharion Dove RVS  Examination Guidelines: A complete evaluation includes B-mode imaging, spectral Doppler, color Doppler, and power Doppler as needed of all accessible  portions of each vessel. Bilateral testing is considered an integral part of a complete examination. Limited examinations for reoccurring indications may be performed as noted.  Right Venous Findings: +---+---------------+---------+-----------+----------+-------+    CompressibilityPhasicitySpontaneityPropertiesSummary +---+---------------+---------+-----------+----------+-------+ CFVFull           Yes      Yes                          +---+---------------+---------+-----------+----------+-------+  Left Venous Findings: +---------+---------------+---------+-----------+----------+-------+          CompressibilityPhasicitySpontaneityPropertiesSummary +---------+---------------+---------+-----------+----------+-------+ CFV      Full           Yes      Yes                          +---------+---------------+---------+-----------+----------+-------+ SFJ      Full                                                 +---------+---------------+---------+-----------+----------+-------+ FV Prox  Full                                                 +---------+---------------+---------+-----------+----------+-------+ FV Mid   Full                                                 +---------+---------------+---------+-----------+----------+-------+ FV DistalFull                                                 +---------+---------------+---------+-----------+----------+-------+  PFV      Full                                                 +---------+---------------+---------+-----------+----------+-------+ POP      Full           Yes      Yes                          +---------+---------------+---------+-----------+----------+-------+ PTV      Full                                                 +---------+---------------+---------+-----------+----------+-------+ PERO     Full                                                  +---------+---------------+---------+-----------+----------+-------+ Area of fluid noted medial knee, proximal calf, and posterior knee, etiology unknown.    Summary: Right: No evidence of common femoral vein obstruction. Left: There is no evidence of deep vein thrombosis in the lower extremity.  *See table(s) above for measurements and observations. Electronically signed by Servando Snare MD on 01/09/2018 at 12:01:57 PM.    Final     Microbiology: Recent Results (from the past 240 hour(s))  Culture, blood (Routine x 2)     Status: None   Collection Time: 01/15/18  3:34 PM  Result Value Ref Range Status   Specimen Description   Final    BLOOD BLOOD LEFT FOREARM Performed at [facility], [address] [place], [place] [address]    Special Requests   Final    BOTTLES DRAWN AEROBIC AND ANAEROBIC Blood Culture results may not be optimal due to an inadequate volume of blood received in culture bottles Performed at [facility], [address] [place], [place] [address]    Culture   Final    NO GROWTH 5 DAYS Performed at [facility], [place] [address], [place], [place] [address]    Report Status 01/21/2018 FINAL  Final  Culture, blood (Routine x 2)     Status: None   Collection Time: 01/15/18  3:44 PM  Result Value Ref Range Status   Specimen Description   Final    BLOOD RIGHT ANTECUBITAL Performed at [facility], [address] [place], [place] [address]    Special Requests   Final    BOTTLES DRAWN AEROBIC AND ANAEROBIC Blood Culture adequate volume Performed at [facility], [address] [place], [place] [address]    Culture   Final    NO GROWTH 5 DAYS Performed at [facility], [place] [address], [place],  [address]    Report Status 01/21/2018 FINAL  Final  MRSA PCR Screening     Status: None   Collection Time: 01/15/18  9:49 PM  Result Value Ref Range Status   MRSA by PCR NEGATIVE NEGATIVE Final    Comment:         The GeneXpert MRSA Assay (  FDA approved for NASAL specimens only), is one component of a comprehensive MRSA colonization surveillance program. It is not intended to diagnose MRSA infection nor to guide or monitor treatment for MRSA infections. Performed at [facility], [place] [address], [place], [place] [address]      Labs: Basic Metabolic Panel: Recent Labs  Lab 01/16/18 0051 01/17/18 1231 01/18/18 0331 01/20/18 0635 01/21/18 0414  NA 135 139 140 143 142  K 4.2 4.8 5.1 4.8 4.8  CL 102 110 111 112* 109  CO2 22 21* 20* 21* 25  GLUCOSE 115* 106* 131* 119* 103*  BUN 63* 47* 44* 26* 22  CREATININE 3.76* 2.12* 1.93* 1.66* 1.31*  CALCIUM 8.3* 8.7* 8.6* 9.6 9.2   Liver Function Tests: Recent Labs  Lab 01/15/18 1531  AST 36  [name] 13  ALKPHOS 46  BILITOT 1.7*  PROT 7.6  ALBUMIN 3.8   No results for input(s): LIPASE, AMYLASE in the last 168 hours. No results for input(s): AMMONIA in the last 168 hours. CBC: Recent Labs  Lab 01/15/18 1531  01/16/18 1937 01/17/18 0406 01/17/18 2035 01/18/18 0331 01/18/18 2042 01/20/18 0635 01/21/18 0414 01/21/18 0744  WBC 8.8   < > 5.0 4.0 3.9* 3.2*  --  3.1* 2.6*  --   NEUTROABS 6.1  --  3.5  --   --   --   --   --   --   --   HGB 10.5*   < > 9.2* 8.2* 7.5* 7.2* 8.0* 8.6* 7.5* 7.6*  HCT 34.0*   < > 30.3* 26.8* 24.6* 23.7* 27.0* 28.5* 25.3* 25.4*  MCV 103.3*   < > 103.4* 104.3* 106.0* 104.4*  --  100.7* 102.0*  --   PLT 147*   < > 100* 102* 99* 103*  --  102* 106*  --    < > = values in this interval not displayed.   Cardiac Enzymes: Recent Labs  Lab 01/15/18 1531 01/16/18 0051 01/16/18 1909 01/17/18 1231  CKTOTAL  --  717* 456* 276  TROPONINI 0.09*  --   --   --    BNP: BNP (last 3 results) Recent Labs    01/09/18 0701  BNP 146.3*    ProBNP (last 3 results) No results for input(s): PROBNP in the last 8760 hours.  CBG: Recent Labs  Lab 01/15/18 2152 01/15/18 2335  GLUCAP 76 121*        Signed:  Kayleen Memos, MD [facility] 01/21/2018, 6:49 PM

## 2018-01-22 LAB — TYPE AND SCREEN
ABO/RH(D): O POS
Antibody Screen: NEGATIVE
UNIT DIVISION: 0
Unit division: 0
Unit division: 0

## 2018-01-22 LAB — CBC
HCT: 32.2 % — ABNORMAL LOW (ref 39.0–52.0)
Hemoglobin: 9.8 g/dL — ABNORMAL LOW (ref 13.0–17.0)
MCH: 30 pg (ref 26.0–34.0)
MCHC: 30.4 g/dL (ref 30.0–36.0)
MCV: 98.5 fL (ref 80.0–100.0)
Platelets: 106 10*3/uL — ABNORMAL LOW (ref 150–400)
RBC: 3.27 MIL/uL — ABNORMAL LOW (ref 4.22–5.81)
RDW: 16.2 % — ABNORMAL HIGH (ref 11.5–15.5)
WBC: 3.4 10*3/uL — ABNORMAL LOW (ref 4.0–10.5)
nRBC: 0 % (ref 0.0–0.2)

## 2018-01-22 LAB — BPAM RBC
Blood Product Expiration Date: 202001082359
Blood Product Expiration Date: 202001082359
Blood Product Expiration Date: 202001112359
ISSUE DATE / TIME: 201912101606
ISSUE DATE / TIME: 201912131030
ISSUE DATE / TIME: 201912131556
UNIT TYPE AND RH: 5100
Unit Type and Rh: 5100
Unit Type and Rh: 5100

## 2018-01-22 LAB — PROTIME-INR
INR: 2.57
PROTHROMBIN TIME: 27.2 s — AB (ref 11.4–15.2)

## 2018-01-22 LAB — HEPARIN LEVEL (UNFRACTIONATED): Heparin Unfractionated: 0.21 IU/mL — ABNORMAL LOW (ref 0.30–0.70)

## 2018-01-22 MED ORDER — SODIUM CHLORIDE 3 % IN NEBU
4.0000 mL | INHALATION_SOLUTION | Freq: Three times a day (TID) | RESPIRATORY_TRACT | Status: DC
  Administered 2018-01-22: 4 mL via RESPIRATORY_TRACT
  Filled 2018-01-22 (×4): qty 4

## 2018-01-22 MED ORDER — DM-GUAIFENESIN ER 30-600 MG PO TB12
2.0000 | ORAL_TABLET | Freq: Two times a day (BID) | ORAL | Status: DC
  Administered 2018-01-22: 2 via ORAL
  Filled 2018-01-22 (×2): qty 2

## 2018-01-22 MED ORDER — BISACODYL 10 MG RE SUPP
10.0000 mg | Freq: Once | RECTAL | Status: AC
  Administered 2018-01-22: 10 mg via RECTAL
  Filled 2018-01-22: qty 1

## 2018-01-22 MED ORDER — FLEET ENEMA 7-19 GM/118ML RE ENEM
1.0000 | ENEMA | Freq: Once | RECTAL | Status: AC
  Administered 2018-01-22: 1 via RECTAL
  Filled 2018-01-22: qty 1

## 2018-01-22 MED ORDER — IPRATROPIUM-ALBUTEROL 0.5-2.5 (3) MG/3ML IN SOLN
3.0000 mL | Freq: Four times a day (QID) | RESPIRATORY_TRACT | Status: DC
  Administered 2018-01-22 (×2): 3 mL via RESPIRATORY_TRACT
  Filled 2018-01-22 (×2): qty 3

## 2018-01-22 MED ORDER — AMLODIPINE BESYLATE 5 MG PO TABS: 5.0000 mg | ORAL_TABLET | Freq: Every day | ORAL | Status: DC

## 2018-01-22 MED ORDER — WARFARIN SODIUM 2 MG PO TABS: 2.0000 mg | ORAL_TABLET | Freq: Once | ORAL | Status: DC

## 2018-01-22 MED ORDER — POLYETHYLENE GLYCOL 3350 17 G PO PACK
17.0000 g | PACK | Freq: Two times a day (BID) | ORAL | Status: DC
  Administered 2018-01-22: 17 g via ORAL
  Filled 2018-01-22: qty 1

## 2018-01-22 NOTE — Discharge Summary (Signed)
Discharge Summary  Ernest Sanchez CNO:[id number] DOB: Nov 13, 1926  PCP: Leanna Battles, MD  Admit date: 01/15/2018 Discharge date: 01/22/2018  Time spent: 35 minutes  Recommendations for Outpatient Follow-up:  1. Transfer to the [facility] at family request (Daughter)  Discharge Diagnoses:  Active Hospital Problems   Diagnosis Date Noted  . Pulmonary nodule   . Hematoma of left lower extremity   . Hypotension due to hypovolemia 01/15/2018  . Cellulitis 01/15/2018  . Severe sepsis [facility])     Resolved Hospital Problems  No resolved problems to display.    Discharge Condition: Stable   Vitals:   01/22/18 0510 01/22/18 0905  BP: (!) 148/82   Pulse: 76   Resp: 18   Temp: 98 F (36.7 C)   SpO2: 98% 98%    History of present illness:  84 year old male with hypothyroidism hypertension on chronic anticoagulation for mitral valve coming in with left lower limb swelling redness suggestive of cellulitis and hypotension second sepsis. Deemed to have hematoma.  No evidence of DVT.  POD #4 post wound VAC placement after 4 compartment fasciotomies.  01/20/2018: Patient seen and examined at his bedside.  Wound VAC noted this morning not to obtain a seal.  Wound care RN consulted and replaced the track pad for VAC.  Patient has no new complaints.  Does not appear to be in distress.   Urinary retention reported by RN this a.m.  Called and spoke with Dr. Junious Silk urology who recommended keeping Foley cath for 5 days and follow-up in his office outpatient.  Patient's daughter expresses discontent with this plan and requests transfer to the [facility].   On 01/21/2018: Patient was seen and examined at his bedside with his daughter and charge nurse present. No acute distress.  Hemoglobin low this morning.  Will be transfused 2 unit PRBCs.  Transfer to [facility] has been initiated today around 1500 as requested by the patient's daughter for reasons stated  above.  01/22/2018: Patient seen and examined at his bedside.  Reports chronic constipation.  Bowel regimen already in place, added Dulcolax and MiraLAX twice daily.  Also reports congestion, added pulmonary toilet.   SIGNIFICANT EVENTS:  12/7 admit for LLE swelling  12/8 found to have L gastrocnemius hematoma. Vascular surgery consulted.  12/9 patient and family have opted for surgery  STUDIES:   Ultrasound Doppler>> no evidence DVT CT LLE 12/8 > Generalized soft tissue edema in the subcutaneous fat circumferentially around the left lower leg as can be seen with cellulitis versus venous insufficiency. 7.3 x 4.2 x 17 cm intramuscular hematoma in the medial gastrocnemius muscle. No soft tissue emphysema to suggest necrotizing infection. Small hemarthrosis. CT chest 12/8 > 4.4 cm posterior left lower lobe mass, compatible primary bronchogenic neoplasm. Small mediastinal nodes, suspicious for nodal metastases. Hilar regions are suboptimally evaluated in the absence of intravenous Contrast. Associated small left pleural effusion, possibly malignant. 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  CULTURES:  Blood cultures>>  no growth in 5 days  ANTIBIOTICS:  Zosyn>> 12/7 >>> Vancomycin>>12/7 >12/9  LINES/TUBES:  Peripheral IVs   Hospital Course:  Active Problems:   Hypotension due to hypovolemia   Cellulitis   Severe sepsis (HCC)   Hematoma of left lower extremity   Pulmonary nodule  Left gastrocnemius hematoma POD #4 post wound VAC placement after 4 compartment fasciotomies Wound VAC placement on 01/17/2018 Wound VAC replacement 01/19/2018 Track pads replaced on 01/20/2018 Wound VAC placement on 01/21/2018 Vascular  surgery followed  Acute left lower limb cellulitis with concomitant peripheral arterial disease Presented with T-max of 100.7, WBC 3.2 Continue antibiotics IV Zosyn  Newly diagnosed urinary retention Curb sided with urology  Dr. Junious Silk who recommended keeping Foley cath for 5 days and following up outpatient in his office after discharge for voiding trial Continue Flomax 0.4 mg daily  Mitral valve replacement on chronic anticoagulation INR is now therapeutic at 2.57 DC heparin drip Continue Coumadin alone Pharmacy managing Goal INR between 2.5 and 3.5 Repeat INR in the morning  AKI on CKD3, improving Back to baseline creatinine of 1.3 and GFR 48  Presented with creatinine of 4.6  Avoid nephrotoxic agents/dehydration/hypotension Renal ultrasound done on 01/09/2018 unremarkable.  No hydronephrosis. Repeat BMP  Acute blood loss anemia, resolved post transfusion 2 unit PRBCs Presented with hemoglobin of 7 Initially transfused 1 unit on presentation Transfused 2 units of PRBCs on 01/21/2018 Hemoglobin now is stable at 9.8 No sign of overt bleeding  Congestion suspect secondary to COPD Start duo nebs every 6 hours and every 4 hours as needed Start pulmonary toilet with Mucinex 1200 mg twice daily and hypersaline nebs 3 times daily for productive cough  Chronic constipation Continue Senokot 2 tablets twice daily Add MiraLAX twice daily Add Dulcolax suppository 10 mg once  Chronic systolic CHF Last 2D echo done on 01/09/2018 revealed LVEF 30 to 35% with diffuse hypokinesis with relative.  Apical akinesis Continue cardiac medications Continue strict I's and O's and daily weight   Hypothyroidism Continue levothyroxine  Recent diagnosis of 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 under investigation CT chest done on 01/16/2018 confirms it Follow-up with pulmonology Dr. Lamonte Sakai outpatient  Emphysema Smoking history Continue duo nebs PRN    DVT PROPHYLAXIS:  Coumadin bridged with heparin drip NUTRITION:Cardiac diet GOALS OF CARE:Full code FAMILY DISCUSSIONS: Updated his daughter at bedside. Consult: Vascular surgery, pulmonology, curb sided with  urology   Discharge Exam: BP (!) 148/82 (BP Location: Right Arm)   Pulse 76   Temp 98 F (36.7 C) (Oral)   Resp 18   Ht 6' (1.829 m)   Wt 84.3 kg   SpO2 98%   BMI 25.21 kg/m  . General: 84 y.o. year-old male well-developed well-nourished in no acute distress.  Alert and interactive.  Patient is very hard of hearing. . Cardiovascular: Regular rate and rhythm with no rubs or gallops.  No JVD or thyromegaly noted. Marland Kitchen Respiratory: Clear to auscultation with no wheezes or rales.  Good respiratory effort.  Abdomen: Soft nontender nondistended with normal bowel sounds x4 quadrants. . Musculoskeletal: Left lower extremity has wound VAC in place with good seal.  No edema in right lower extremity. Marland Kitchen Psychiatry: Mood is appropriate for condition and setting  Discharge Instructions You were cared for by a hospitalist during your hospital stay. If you have any questions about your discharge medications or the care you received while you were in the hospital after you are discharged, you can call the unit and asked to speak with the hospitalist on call if the hospitalist that took care of you is not available. Once you are discharged, your primary care physician will handle any further medical issues. Please note that NO REFILLS for any discharge medications will be authorized once you are discharged, as it is imperative that you return to your primary care physician (or establish a relationship with a primary care physician if you do not have one) for your aftercare needs so that they  can reassess your need for medications and monitor your lab values.   Allergies as of 01/22/2018      Reactions   Zantac [ranitidine Hcl]    Unknown reaction      Medication List    STOP taking these medications   diclofenac sodium 1 % Gel Commonly known as:  VOLTAREN   enoxaparin 120 MG/0.8ML injection Commonly known as:  LOVENOX   furosemide 40 MG tablet Commonly known as:  LASIX   lisinopril 40 MG  tablet Commonly known as:  PRINIVIL,ZESTRIL   polyethylene glycol powder powder Commonly known as:  GLYCOLAX/MIRALAX     TAKE these medications   albuterol 108 (90 Base) MCG/ACT inhaler Commonly known as:  PROVENTIL HFA;VENTOLIN HFA Inhale into the lungs every 6 (six) hours as needed for wheezing or shortness of breath.   citalopram 40 MG tablet Commonly known as:  CELEXA Take 20 mg by mouth daily.   dorzolamide 2 % ophthalmic solution Commonly known as:  TRUSOPT Place 1 drop into both eyes 2 (two) times daily.   gabapentin 300 MG capsule Commonly known as:  NEURONTIN Take 600 mg by mouth 2 (two) times daily.   levothyroxine 100 MCG tablet Commonly known as:  SYNTHROID, LEVOTHROID Take 100 mcg by mouth daily before breakfast.   metoprolol succinate 25 MG 24 hr tablet Commonly known as:  TOPROL-XL Take 1 tablet (25 mg total) by mouth daily. What changed:    medication strength  how much to take   mirtazapine 15 MG tablet Commonly known as:  REMERON Take 15 mg by mouth at bedtime.   tamsulosin 0.4 MG Caps capsule Commonly known as:  FLOMAX Take 1 capsule (0.4 mg total) by mouth daily.   temazepam 7.5 MG capsule Commonly known as:  RESTORIL Take 7.5 mg by mouth at bedtime.   warfarin 4 MG tablet Commonly known as:  COUMADIN Take 2-4 mg by mouth See admin instructions. Take 4mg  by mouth on MON WED FRI and 2mg  on all other days.      Allergies  Allergen Reactions  . Zantac [Ranitidine Hcl]     Unknown reaction   Follow-up Information    Collene Gobble, MD Follow up on 02/15/2018.   Specialty:  Pulmonary Disease Why:  11:15am  Contact information: [place] [address] [phone]        Leanna Battles, MD. Call in 1 day(s).   Specialty:  Internal Medicine Why:  Please call for post hospital follow-up appointment. Contact information: [address] [place] [address] [phone]            The results  of significant diagnostics from this hospitalization (including imaging, microbiology, ancillary and laboratory) are listed below for reference.    Significant Diagnostic Studies: Dg Chest 2 View  Result Date: 01/08/2018 CLINICAL DATA:  Intermittent LEFT lower extremity swelling with chest pain. History of pneumonia and chronic interstitial lung disease. EXAM: CHEST - 2 VIEW COMPARISON:  None. FINDINGS: Elevated LEFT hemidiaphragm. Cardiac silhouette is mild-to-moderately enlarged. Pulmonary vascular congestion. Fullness of bilateral hilar with interstitial prominence. No pleural effusion or focal consolidation. LEFT AICD with lead tips projecting RIGHT atrium and bilateral ventricles. Status post CABG. Calcified aortic arch. Biapical pleural thickening. No pneumothorax.Age indeterminate LEFT rib fractures. RIGHT ninth rib lucency. IMPRESSION: Cardiomegaly and vascular congestion. Age indeterminate interstitial prominence. Fullness of the hila could reflect vascular shadows though lymphadenopathy is possible. Recommend non emergent CT chest with contrast. Age indeterminate LEFT rib  fractures. Possible lytic lesion RIGHT rib versus projectional artifact. Electronically Signed   By: Elon Alas M.D.   On: 01/08/2018 19:31   Ct Chest Wo Contrast  Result Date: 01/16/2018 CLINICAL DATA:  Lung mass on CT runoff EXAM: CT CHEST WITHOUT CONTRAST TECHNIQUE: Multidetector CT imaging of the chest was performed following the standard protocol without IV contrast. COMPARISON:  Partial comparison to CTA abdomen/pelvis/runoff dated 01/09/2018 FINDINGS: Cardiovascular: Cardiomegaly with left atrial enlargement. No pericardial effusion. Hyperdense blood pool relative to myocardium, suggesting anemia. Left subclavian ICD. No evidence of thoracic aortic aneurysm. Atherosclerotic calcifications of the aortic arch. Three vessel coronary atherosclerosis. Mediastinum/Nodes: Small mediastinal lymph nodes, including an 11 mm  short axis low right paratracheal node (series 4/image 47) and a 13 mm short axis subcarinal node (series 4/image 64). Hilar regions are suboptimally evaluated in the absence of intravenous contrast. Visualized thyroid is unremarkable. Lungs/Pleura: 2.8 x 3.4 x 4.4 cm posterior left lower lobe mass (series 4/image 86), compatible with primary bronchogenic neoplasm. Associated small loculated left pleural effusion, possibly malignant. Mild patchy/ground-glass opacity in the posterior right upper lobe, measuring 3.0 x 1.8 cm (series 8/image 47), nonspecific. Mild infection could have this appearance. Biapical pleural-parenchymal scarring. No pneumothorax. Eventration of the left hemidiaphragm. Upper Abdomen: Visualized upper abdomen is better evaluated on recent CTA, with suspected multifocal hepatic metastases. Musculoskeletal: Exaggerated midthoracic kyphosis. Median sternotomy. IMPRESSION: 4.4 cm posterior left lower lobe mass, compatible primary bronchogenic neoplasm. Small mediastinal nodes, suspicious for nodal metastases. Hilar regions are suboptimally evaluated in the absence of intravenous contrast. Associated small left pleural effusion, possibly malignant. 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 Aortic Atherosclerosis (ICD10-I70.0). Electronically Signed   By: Julian Hy M.D.   On: 01/16/2018 20:02   Ct Angio Aortobifemoral W And/or Wo Contrast  Addendum Date: 01/09/2018   ADDENDUM REPORT: 01/09/2018 04:30 ADDENDUM: Findings discussed with Dr Marlowe Sax at 04:20 am Electronically Signed   By: Keith Rake M.D.   On: 01/09/2018 04:30   Addendum Date: 01/09/2018   ADDENDUM REPORT: 01/09/2018 04:05 ADDENDUM: Findings conveyed to patient's nurse Lillia Dallas at 04:04 am Electronically Signed   By: Keith Rake M.D.   On: 01/09/2018 04:05   Result Date: 01/09/2018 CLINICAL DATA:  Left lower extremity edema. Cool right foot and ankle. Nonpalpable right foot  pulses. EXAM: CT ANGIOGRAPHY OF ABDOMINAL AORTA WITH ILIOFEMORAL RUNOFF TECHNIQUE: Multidetector CT imaging of the abdomen, pelvis and lower extremities was performed using the standard protocol during bolus administration of intravenous contrast. Multiplanar CT image reconstructions and MIPs were obtained to evaluate the vascular anatomy. CONTRAST:  51mL ISOVUE-370 IOPAMIDOL (ISOVUE-370) INJECTION 76% COMPARISON:  None. FINDINGS: VASCULAR Aorta: Aortic atherosclerosis without aneurysm, dissection, vasculitis or significant stenosis. Celiac: Patent without evidence of aneurysm, dissection, vasculitis or significant stenosis. Mild plaque at the origin. SMA: Patent without evidence of aneurysm, dissection, vasculitis or significant stenosis. Mild plaque at the origin. Renals: Both renal arteries are patent without evidence of aneurysm, dissection, vasculitis, fibromuscular dysplasia or significant stenosis. Mild plaque at the origin. IMA: Patent. RIGHT Lower Extremity Inflow: Common, internal and external iliac arteries are patent without evidence of aneurysm, dissection, vasculitis or significant stenosis. Mild to moderate calcified plaque throughout. Outflow: The common, superficial and profunda femoral arteries are patent. There is generalized diminished flow throughout the SFA which remains normal in caliber. No flow in the popliteal artery. Runoff: No visualized flow in the calf arteries. LEFT Lower Extremity Inflow: Common, internal and external iliac arteries are patent  without evidence of aneurysm, dissection, vasculitis or significant stenosis. Outflow: Common, superficial and profunda femoral arteries and the popliteal artery are patent without evidence of aneurysm, dissection, vasculitis or significant stenosis. There is perivascular stranding about the popliteal vein. Runoff: Patent three vessel runoff to the ankle. Veins: Venous structures not well assessed given phase of contrast. Scoliotic curvature and  multilevel degenerative change throughout the thoracic spine. Perivascular in intramuscular stranding about the left popliteal vein. Examination not tailored for venous assessment. Review of the MIP images confirms the above findings. NON-VASCULAR Lower chest: Peripheral 4.6 x 3.1 x 3.0 cm spiculated lung mass in the left lower lobe abuts the pleura. Nonspecific ground-glass opacity in the right upper lobe, partially included. Elevated left hemidiaphragm. Pacemaker partially included. Multi chamber cardiomegaly. Calcified bilateral hilar and mediastinal lymph nodes. Hepatobiliary: Multiple small low-density lesions throughout the liver, incompletely assessed on arterial phase exam. Gallbladder physiologically distended, no calcified stone. No biliary dilatation. Pancreas: Parenchymal atrophy. No ductal dilatation or inflammation. Spleen: Normal in size and arterial phase enhancement. Adrenals/Urinary Tract: No adrenal nodule. No hydronephrosis. Minimal symmetric perinephric edema. Urinary bladder is physiologically distended. Stomach/Bowel: Stomach is nondistended. Duodenal diverticulum. No bowel wall thickening, inflammatory change or obstruction. Distal colonic diverticulosis without diverticulitis. Lymphatic: No enlarged abdominopelvic lymph nodes. Reproductive: Prominent prostate gland spans 5.7 cm. Other: No free fluid. Musculoskeletal: There is intramuscular stranding and heterogeneous enlargement about the posterior muscle compartment in the calf, possible intramuscular hematoma. Stranding also involves the posterior thigh muscle compartment, extending along the popliteal vein. Left knee joint effusion. Diffuse subcutaneous edema about the left calf and distal lower extremity. IMPRESSION: VASCULAR 1. Right lower extremity arterial occlusion from the popliteal artery distally. Mild to moderate atherosclerosis of the proximal right leg vasculature without additional sites of stenosis. 2. Diffuse aortic and  peripheral vessel atherosclerosis without additional site of flow-limiting stenosis or acute arterial findings. 3. Intramuscular stranding in the left lower extremity including stranding about the popliteal vein. Venous structures cannot be assessed on the current exam given phase of contrast. Recommend correlation with left lower extremity duplex to evaluate for DVT. NON-VASCULAR 1. Left lower lobe pulmonary masslike opacity measures 4.6 x 3.1 cm, primary bronchogenic malignancy until proven otherwise. 2. Nonspecific ground-glass opacity in the right upper lobe of the lung. 3. Multiple hepatic hypodensities which are suspicious for metastatic disease in the setting of suspected pulmonary malignancy. 4. Heterogeneous stranding and enlargement of the distal posterior left thigh muscles as well as posterior compartment of the calf. Given elevated INR, intramuscular hematoma is favored. I am awaiting a call back from the referring clinician. Electronically Signed: By: Keith Rake M.D. On: 01/09/2018 03:24   US Renal  Result Date: 01/09/2018 CLINICAL DATA:  Initial evaluation for elevated creatinine. EXAM: RENAL / URINARY TRACT ULTRASOUND COMPLETE COMPARISON:  Prior CT from earlier same day. FINDINGS: Right Kidney: Renal measurements: 9.5 x 4.6 x 3.9 cm = volume: 87.1 mL . Echogenicity within normal limits. No mass or hydronephrosis visualized. Left Kidney: Renal measurements: 10.0 x 5.8 x 4.6 cm = volume: 137.9 mL. Echogenicity within normal limits. No mass or hydronephrosis visualized. Bladder: Appears normal for degree of bladder distention. Bilateral ureteral jets visualized. IMPRESSION: Negative renal ultrasound.  No hydronephrosis. Electronically Signed   By: Jeannine Boga M.D.   On: 01/09/2018 04:37   Dg Chest Port 1 View  Result Date: 01/15/2018 CLINICAL DATA:  Confusion.  Sepsis. EXAM: PORTABLE CHEST 1 VIEW COMPARISON:  01/08/2018 FINDINGS: The heart is enlarged but stable.  Stable pacer  wires/AICD. Stable surgical changes from mitral valve replacement surgery. Mild tortuosity and calcification of the thoracic aorta. Marked elevation of the left hemidiaphragm with overlying vascular crowding, atelectasis and scarring. Stable biapical pleural and parenchymal scarring. No definite acute superimposed pulmonary process. Healed bilateral rib fractures. IMPRESSION: Stable cardiac enlargement. Chronic emphysematous and bronchitic type lung changes with dense biapical pleural and parenchymal scarring. No definite acute overlying pulmonary process. Electronically Signed   By: Marijo Sanes M.D.   On: 01/15/2018 16:22   Vas [place] Burnard Bunting With/wo Tbi  Result Date: 01/09/2018 LOWER EXTREMITY DOPPLER STUDY Indications: Right distal popliteal artery occlusion by CTA. High Risk Factors: Hypertension, hyperlipidemia.  Comparison Study: No prior study on file Performing Technologist: Sharion Dove RVS  Examination Guidelines: A complete evaluation includes at minimum, Doppler waveform signals and systolic blood pressure reading at the level of bilateral brachial, anterior tibial, and posterior tibial arteries, when vessel segments are accessible. Bilateral testing is considered an integral part of a complete examination. Photoelectric Plethysmograph (PPG) waveforms and toe systolic pressure readings are included as required and additional duplex testing as needed. Limited examinations for reoccurring indications may be performed as noted.  ABI Findings: +---------+------------------+-----+---------+--------+ Right    Rt Pressure (mmHg)IndexWaveform Comment  +---------+------------------+-----+---------+--------+ Brachial 134                    triphasic         +---------+------------------+-----+---------+--------+ PTA      197               1.41 triphasic         +---------+------------------+-----+---------+--------+ DP       172               1.23 triphasic          +---------+------------------+-----+---------+--------+ Great Toe123               0.88                   +---------+------------------+-----+---------+--------+ +---------+------------------+-----+---------+-------+ Left     Lt Pressure (mmHg)IndexWaveform Comment +---------+------------------+-----+---------+-------+ Brachial 140                    triphasic        +---------+------------------+-----+---------+-------+ PTA      175               1.25 triphasic        +---------+------------------+-----+---------+-------+ DP       157               1.12 triphasic        +---------+------------------+-----+---------+-------+ Gardiner Rhyme               0.84                  +---------+------------------+-----+---------+-------+ +-------+-----------+-----------+------------+------------+ ABI/TBIToday's ABIToday's TBIPrevious ABIPrevious TBI +-------+-----------+-----------+------------+------------+ Right  1.41       0.88                                +-------+-----------+-----------+------------+------------+ Left   1.25       0.84                                +-------+-----------+-----------+------------+------------+  Summary: Right: Resting right ankle-brachial index indicates noncompressible right lower extremity arteries.The right toe-brachial index is normal. Left: Resting  left ankle-brachial index is within normal range. No evidence of significant left lower extremity arterial disease. The left toe-brachial index is normal.  *See table(s) above for measurements and observations.  Electronically signed by Servando Snare MD on 01/09/2018 at 8:55:47 PM.    Final    Ct Extremity Lower Left Wo Contrast  Result Date: 01/16/2018 CLINICAL DATA:  Left lower extremity cellulitis. Evaluate for necrotizing fasciitis. EXAM: CT OF THE LOWER LEFT EXTREMITY WITHOUT CONTRAST TECHNIQUE: Multidetector CT imaging of the lower left extremity was performed according to the  standard protocol. COMPARISON:  None. FINDINGS: Bones/Joint/Cartilage No fracture or dislocation. Normal alignment. High-density joint effusion concerning for hemarthrosis. Mild medial femorotibial compartment joint space narrowing. Mild lateral patellofemoral compartment joint space narrowing. No periosteal reaction or bone destruction. No aggressive osseous lesion. Mild osteoarthritis of the subtalar joints. Ligaments Ligaments are suboptimally evaluated by CT. Muscles and Tendons Flexor, extensor, peroneal and Achilles tendons are grossly intact. Patellar tendon and quadriceps tendon are grossly intact. 7.3 x 4.2 x 17 cm heterogeneous hyperdense fluid collection in the medial gastrocnemius muscle most consistent with an intramuscular hematoma. Mild generalized muscle atrophy. Soft tissue No fluid collection or hematoma. No soft tissue mass. Soft tissue edema circumferentially in the subcutaneous fat around left lower leg. No soft tissue emphysema. No radiopaque foreign body. Peripheral vascular atherosclerotic disease. IMPRESSION: 1. Generalized soft tissue edema in the subcutaneous fat circumferentially around the left lower leg as can be seen with cellulitis versus venous insufficiency. 2. 7.3 x 4.2 x 17 cm intramuscular hematoma in the medial gastrocnemius muscle. 3. No soft tissue emphysema to suggest necrotizing infection. 4. Small hemarthrosis. Electronically Signed   By: Kathreen Devoid   On: 01/16/2018 17:01   Vas [place] Lower Extremity Venous (dvt)  Result Date: 01/17/2018  Lower Venous Study Indications: Edema. Other Indications: Large hematoma noted by CT. Limitations: Edema. Comparison Study: Prior negative study done 01/09/18 Performing Technologist: Sharion Dove RVS  Examination Guidelines: A complete evaluation includes B-mode imaging, spectral Doppler, color Doppler, and power Doppler as needed of all accessible portions of each vessel. Bilateral testing is considered an integral part of a complete  examination. Limited examinations for reoccurring indications may be performed as noted.  Right Venous Findings: +---+---------------+---------+-----------+----------+-------+    CompressibilityPhasicitySpontaneityPropertiesSummary +---+---------------+---------+-----------+----------+-------+ CFVFull           Yes      Yes                          +---+---------------+---------+-----------+----------+-------+  Left Venous Findings: +---------+---------------+---------+-----------+----------+----------+          CompressibilityPhasicitySpontaneityPropertiesSummary    +---------+---------------+---------+-----------+----------+----------+ CFV      Full           Yes      Yes                             +---------+---------------+---------+-----------+----------+----------+ SFJ      Full                                                    +---------+---------------+---------+-----------+----------+----------+ FV Prox  Full                                                    +---------+---------------+---------+-----------+----------+----------+  FV Mid   Full                                                    +---------+---------------+---------+-----------+----------+----------+ FV DistalFull                                                    +---------+---------------+---------+-----------+----------+----------+ PFV      Full                                                    +---------+---------------+---------+-----------+----------+----------+ POP      Full           Yes      Yes                             +---------+---------------+---------+-----------+----------+----------+ PTV                                                   visualized +---------+---------------+---------+-----------+----------+----------+ Large hematoma noted mid calf to popliteal fossa  Left Technical Findings: Not visualized segments include peroneal.   Summary:  Right: No evidence of common femoral vein obstruction. Left: There is no evidence of deep vein thrombosis in the lower extremity.  *See table(s) above for measurements and observations. Electronically signed by Servando Snare MD on 01/17/2018 at 4:54:48 PM.    Final    Vas [place] Lower Extremity Venous (dvt)  Result Date: 01/09/2018  Lower Venous Study Indications: Left leg/knee pain X 2 weeks.  Performing Technologist: Sharion Dove RVS  Examination Guidelines: A complete evaluation includes B-mode imaging, spectral Doppler, color Doppler, and power Doppler as needed of all accessible portions of each vessel. Bilateral testing is considered an integral part of a complete examination. Limited examinations for reoccurring indications may be performed as noted.  Right Venous Findings: +---+---------------+---------+-----------+----------+-------+    CompressibilityPhasicitySpontaneityPropertiesSummary +---+---------------+---------+-----------+----------+-------+ CFVFull           Yes      Yes                          +---+---------------+---------+-----------+----------+-------+  Left Venous Findings: +---------+---------------+---------+-----------+----------+-------+          CompressibilityPhasicitySpontaneityPropertiesSummary +---------+---------------+---------+-----------+----------+-------+ CFV      Full           Yes      Yes                          +---------+---------------+---------+-----------+----------+-------+ SFJ      Full                                                 +---------+---------------+---------+-----------+----------+-------+ FV Prox  Full                                                 +---------+---------------+---------+-----------+----------+-------+  FV Mid   Full                                                 +---------+---------------+---------+-----------+----------+-------+ FV DistalFull                                                  +---------+---------------+---------+-----------+----------+-------+ PFV      Full                                                 +---------+---------------+---------+-----------+----------+-------+ POP      Full           Yes      Yes                          +---------+---------------+---------+-----------+----------+-------+ PTV      Full                                                 +---------+---------------+---------+-----------+----------+-------+ PERO     Full                                                 +---------+---------------+---------+-----------+----------+-------+ Area of fluid noted medial knee, proximal calf, and posterior knee, etiology unknown.    Summary: Right: No evidence of common femoral vein obstruction. Left: There is no evidence of deep vein thrombosis in the lower extremity.  *See table(s) above for measurements and observations. Electronically signed by Servando Snare MD on 01/09/2018 at 12:01:57 PM.    Final     Microbiology: Recent Results (from the past 240 hour(s))  Culture, blood (Routine x 2)     Status: None   Collection Time: 01/15/18  3:34 PM  Result Value Ref Range Status   Specimen Description   Final    BLOOD BLOOD LEFT FOREARM Performed at [facility], [address] [place], [place] [address]    Special Requests   Final    BOTTLES DRAWN AEROBIC AND ANAEROBIC Blood Culture results may not be optimal due to an inadequate volume of blood received in culture bottles Performed at [facility], [address] [place], [place] [address]    Culture   Final    NO GROWTH 5 DAYS Performed at [facility], [place] [address] [place], [place] [address]    Report Status 01/21/2018 FINAL  Final  Culture, blood (Routine x 2)     Status: None   Collection Time: 01/15/18  3:44 PM  Result Value Ref Range Status   Specimen Description   Final    BLOOD RIGHT ANTECUBITAL Performed at [facility], [address] [place], [place] [address]    Special Requests   Final    BOTTLES  DRAWN AEROBIC AND ANAEROBIC Blood Culture adequate volume Performed at [facility], [address] [place], [place] [address]    Culture   Final    NO GROWTH 5 DAYS Performed at [facility], [place] [address], [place], [place] [address]    Report Status 01/21/2018 FINAL  Final  MRSA PCR Screening     Status: None   Collection Time: 01/15/18  9:49 PM  Result Value Ref Range Status   MRSA by PCR NEGATIVE NEGATIVE Final    Comment:        The GeneXpert MRSA Assay (FDA approved for NASAL specimens only), is one component of a comprehensive MRSA colonization surveillance program. It is not intended to diagnose MRSA infection nor to guide or monitor treatment for MRSA infections. Performed at [facility], [place] [address] [place], [place] [address]      Labs: Basic Metabolic Panel: Recent Labs  Lab 01/16/18 0051 01/17/18 1231 01/18/18 0331 01/20/18 0635 01/21/18 0414  NA 135 139 140 143 142  K 4.2 4.8 5.1 4.8 4.8  CL 102 110 111 112* 109  CO2 22 21* 20* 21* 25  GLUCOSE 115* 106* 131* 119* 103*  BUN 63* 47* 44* 26* 22  CREATININE 3.76* 2.12* 1.93* 1.66* 1.31*  CALCIUM 8.3* 8.7* 8.6* 9.6 9.2   Liver Function Tests: Recent Labs  Lab 01/15/18 1531  AST 36  [name] 13  ALKPHOS 46  BILITOT 1.7*  PROT 7.6  ALBUMIN 3.8   No results for input(s): LIPASE, AMYLASE in the last 168 hours. No results for input(s): AMMONIA in the last 168 hours. CBC: Recent Labs  Lab 01/15/18 1531  01/16/18 1937  01/17/18 2035 01/18/18 0331 01/18/18 2042 01/20/18 0635 01/21/18 0414 01/21/18 0744 01/22/18 0337  WBC 8.8   < > 5.0   < > 3.9* 3.2*  --  3.1* 2.6*  --  3.4*  NEUTROABS 6.1  --  3.5  --   --   --   --   --   --   --   --   HGB 10.5*   < > 9.2*   < > 7.5* 7.2* 8.0* 8.6* 7.5* 7.6* 9.8*  HCT 34.0*   < > 30.3*   < > 24.6* 23.7* 27.0* 28.5* 25.3* 25.4* 32.2*  MCV  103.3*   < > 103.4*   < > 106.0* 104.4*  --  100.7* 102.0*  --  98.5  PLT 147*   < > 100*   < > 99* 103*  --  102* 106*  --  106*   < > = values in this interval not displayed.   Cardiac Enzymes: Recent Labs  Lab 01/15/18 1531 01/16/18 0051 01/16/18 1909 01/17/18 1231  CKTOTAL  --  717* 456* 276  TROPONINI 0.09*  --   --   --    BNP: BNP (last 3 results) Recent Labs    01/09/18 0701  BNP 146.3*    ProBNP (last 3 results) No results for input(s): PROBNP in the last 8760 hours.  CBG: Recent Labs  Lab 01/15/18 2152 01/15/18 2335  GLUCAP 76 121*       Signed:  Kayleen Memos, MD [facility] 01/22/2018, 10:05 AM

## 2018-01-22 NOTE — Progress Notes (Signed)
ANTICOAGULATION CONSULT NOTE - Follow Up Consult  Pharmacy Consult for Heparin/Coumadin Indication: mechanical mitral valve, afib  Allergies  Allergen Reactions  . Zantac [Ranitidine Hcl]     Unknown reaction    Patient Measurements: Height: 6' (182.9 cm) Weight: 185 lb 13.6 oz (84.3 kg) IBW/kg (Calculated) : 77.6  Vital Signs: Temp: 98 F (36.7 C) (12/14 0510) Temp Source: Oral (12/14 0510) BP: 148/82 (12/14 0510) Pulse Rate: 76 (12/14 0510)  Labs: Recent Labs    01/20/18 0635 01/21/18 0414 01/21/18 0744 01/22/18 0337  HGB 8.6* 7.5* 7.6* 9.8*  HCT 28.5* 25.3* 25.4* 32.2*  PLT 102* 106*  --  106*  LABPROT 19.8* 24.5*  --  27.2*  INR 1.71 2.24  --  2.57  HEPARINUNFRC 0.34 0.41  --  0.21*  CREATININE 1.66* 1.31*  --   --     Estimated Creatinine Clearance: 42 mL/min (A) (by C-G formula based on SCr of 1.31 mg/dL (H)).  Assessment: 84 y/o M on Coumadin PTA for mechanical mitral valve + hx Afib. Coumadin has been held past 12/7 dose as pt for interventions for L calf hematoma. Heparin bridge started 12/7. Heparin discontinued 12/8 PM d/t oozing from line/hematoma. Resume heparin gtt with warfarin bridge 12/11 per vascular.  INR therapeutic at 2.57 today. Heparin subtherapeutic. Hgb increased to 9.8, s/p pRBCs 12/13.  PTA warfarin 4 mg MWF, 2 mg AOD. Patient [facility] today.   Goal of Therapy:  INR goal 2.5-3.5 Heparin level 0.3-0.7 units/ml Monitor platelets by anticoagulation protocol: Yes   Plan:  -Warfarin 2mg  PO x1 tonight -Discontinue heparin gtt due to therapeutic INR -Daily INR, monitor for s/sx of bleeding    Azzie Roup D  PGY1 Pharmacy Resident   Phone [phone] Please use [facility] for clinical pharmacists numbers  01/22/2018      7:40 AM

## 2018-01-24 ENCOUNTER — Telehealth: Payer: Self-pay | Admitting: Vascular Surgery

## 2018-01-24 NOTE — Telephone Encounter (Signed)
-----   Message from Waynetta Sandy, MD sent at 01/21/2018  5:51 PM EST ----- Ernest Sanchez [id number] 1926/11/07  F/u in 3-4 weeks with np/pa or me for wound check.   brandon

## 2018-01-24 NOTE — Telephone Encounter (Signed)
sch appt lvm mld ltr 02/25/2018 1pm wound check

## 2018-01-28 DIAGNOSIS — I10 Essential (primary) hypertension: Secondary | ICD-10-CM | POA: Diagnosis not present

## 2018-01-28 DIAGNOSIS — R2689 Other abnormalities of gait and mobility: Secondary | ICD-10-CM | POA: Diagnosis not present

## 2018-01-28 DIAGNOSIS — F329 Major depressive disorder, single episode, unspecified: Secondary | ICD-10-CM | POA: Diagnosis not present

## 2018-01-28 DIAGNOSIS — J449 Chronic obstructive pulmonary disease, unspecified: Secondary | ICD-10-CM | POA: Diagnosis not present

## 2018-01-28 DIAGNOSIS — I82409 Acute embolism and thrombosis of unspecified deep veins of unspecified lower extremity: Secondary | ICD-10-CM | POA: Diagnosis not present

## 2018-01-28 DIAGNOSIS — Z952 Presence of prosthetic heart valve: Secondary | ICD-10-CM | POA: Diagnosis not present

## 2018-01-28 DIAGNOSIS — Z8709 Personal history of other diseases of the respiratory system: Secondary | ICD-10-CM | POA: Diagnosis not present

## 2018-01-28 DIAGNOSIS — I5020 Unspecified systolic (congestive) heart failure: Secondary | ICD-10-CM | POA: Diagnosis not present

## 2018-01-28 DIAGNOSIS — Z95810 Presence of automatic (implantable) cardiac defibrillator: Secondary | ICD-10-CM | POA: Diagnosis not present

## 2018-01-28 DIAGNOSIS — Z48817 Encounter for surgical aftercare following surgery on the skin and subcutaneous tissue: Secondary | ICD-10-CM | POA: Diagnosis not present

## 2018-01-28 DIAGNOSIS — L97929 Non-pressure chronic ulcer of unspecified part of left lower leg with unspecified severity: Secondary | ICD-10-CM | POA: Diagnosis not present

## 2018-01-28 DIAGNOSIS — M109 Gout, unspecified: Secondary | ICD-10-CM | POA: Diagnosis not present

## 2018-01-28 DIAGNOSIS — I2510 Atherosclerotic heart disease of native coronary artery without angina pectoris: Secondary | ICD-10-CM | POA: Diagnosis not present

## 2018-01-28 DIAGNOSIS — E785 Hyperlipidemia, unspecified: Secondary | ICD-10-CM | POA: Diagnosis not present

## 2018-01-28 DIAGNOSIS — M6250 Muscle wasting and atrophy, not elsewhere classified, unspecified site: Secondary | ICD-10-CM | POA: Diagnosis not present

## 2018-01-28 DIAGNOSIS — I482 Chronic atrial fibrillation, unspecified: Secondary | ICD-10-CM | POA: Diagnosis not present

## 2018-01-28 DIAGNOSIS — R339 Retention of urine, unspecified: Secondary | ICD-10-CM | POA: Diagnosis not present

## 2018-01-28 DIAGNOSIS — M179 Osteoarthritis of knee, unspecified: Secondary | ICD-10-CM | POA: Diagnosis not present

## 2018-01-28 DIAGNOSIS — K7689 Other specified diseases of liver: Secondary | ICD-10-CM | POA: Diagnosis not present

## 2018-01-28 DIAGNOSIS — E039 Hypothyroidism, unspecified: Secondary | ICD-10-CM | POA: Diagnosis not present

## 2018-01-28 DIAGNOSIS — K219 Gastro-esophageal reflux disease without esophagitis: Secondary | ICD-10-CM | POA: Diagnosis not present

## 2018-01-28 DIAGNOSIS — G4700 Insomnia, unspecified: Secondary | ICD-10-CM | POA: Diagnosis not present

## 2018-01-28 DIAGNOSIS — R918 Other nonspecific abnormal finding of lung field: Secondary | ICD-10-CM | POA: Diagnosis not present

## 2018-01-28 DIAGNOSIS — Z7901 Long term (current) use of anticoagulants: Secondary | ICD-10-CM | POA: Diagnosis not present

## 2018-01-28 DIAGNOSIS — D500 Iron deficiency anemia secondary to blood loss (chronic): Secondary | ICD-10-CM | POA: Diagnosis not present

## 2018-01-28 DIAGNOSIS — S8012XD Contusion of left lower leg, subsequent encounter: Secondary | ICD-10-CM | POA: Diagnosis not present

## 2018-01-28 DIAGNOSIS — R400 Somnolence: Secondary | ICD-10-CM | POA: Diagnosis not present

## 2018-01-28 DIAGNOSIS — M6281 Muscle weakness (generalized): Secondary | ICD-10-CM | POA: Diagnosis not present

## 2018-01-28 DIAGNOSIS — H409 Unspecified glaucoma: Secondary | ICD-10-CM | POA: Diagnosis not present

## 2018-01-28 DIAGNOSIS — S81802A Unspecified open wound, left lower leg, initial encounter: Secondary | ICD-10-CM | POA: Diagnosis not present

## 2018-01-28 DIAGNOSIS — I130 Hypertensive heart and chronic kidney disease with heart failure and stage 1 through stage 4 chronic kidney disease, or unspecified chronic kidney disease: Secondary | ICD-10-CM | POA: Diagnosis not present

## 2018-01-28 DIAGNOSIS — R293 Abnormal posture: Secondary | ICD-10-CM | POA: Diagnosis not present

## 2018-01-31 DIAGNOSIS — I10 Essential (primary) hypertension: Secondary | ICD-10-CM | POA: Diagnosis not present

## 2018-01-31 DIAGNOSIS — M109 Gout, unspecified: Secondary | ICD-10-CM | POA: Diagnosis not present

## 2018-01-31 DIAGNOSIS — I82409 Acute embolism and thrombosis of unspecified deep veins of unspecified lower extremity: Secondary | ICD-10-CM | POA: Diagnosis not present

## 2018-01-31 DIAGNOSIS — I5020 Unspecified systolic (congestive) heart failure: Secondary | ICD-10-CM | POA: Diagnosis not present

## 2018-02-15 ENCOUNTER — Inpatient Hospital Stay: Payer: Medicare Other | Admitting: Emergency Medicine

## 2018-02-15 DIAGNOSIS — L97929 Non-pressure chronic ulcer of unspecified part of left lower leg with unspecified severity: Secondary | ICD-10-CM | POA: Diagnosis not present

## 2018-02-17 DIAGNOSIS — I82409 Acute embolism and thrombosis of unspecified deep veins of unspecified lower extremity: Secondary | ICD-10-CM | POA: Diagnosis not present

## 2018-02-17 DIAGNOSIS — S81802A Unspecified open wound, left lower leg, initial encounter: Secondary | ICD-10-CM | POA: Diagnosis not present

## 2018-02-19 DIAGNOSIS — Z7901 Long term (current) use of anticoagulants: Secondary | ICD-10-CM | POA: Diagnosis not present

## 2018-02-19 DIAGNOSIS — J449 Chronic obstructive pulmonary disease, unspecified: Secondary | ICD-10-CM | POA: Diagnosis not present

## 2018-02-19 DIAGNOSIS — N189 Chronic kidney disease, unspecified: Secondary | ICD-10-CM | POA: Diagnosis not present

## 2018-02-19 DIAGNOSIS — I959 Hypotension, unspecified: Secondary | ICD-10-CM | POA: Diagnosis not present

## 2018-02-19 DIAGNOSIS — E860 Dehydration: Secondary | ICD-10-CM | POA: Diagnosis not present

## 2018-02-19 DIAGNOSIS — I82402 Acute embolism and thrombosis of unspecified deep veins of left lower extremity: Secondary | ICD-10-CM | POA: Diagnosis not present

## 2018-02-19 DIAGNOSIS — E785 Hyperlipidemia, unspecified: Secondary | ICD-10-CM | POA: Diagnosis not present

## 2018-02-19 DIAGNOSIS — I482 Chronic atrial fibrillation, unspecified: Secondary | ICD-10-CM | POA: Diagnosis not present

## 2018-02-19 DIAGNOSIS — Z95810 Presence of automatic (implantable) cardiac defibrillator: Secondary | ICD-10-CM | POA: Diagnosis not present

## 2018-02-19 DIAGNOSIS — E039 Hypothyroidism, unspecified: Secondary | ICD-10-CM | POA: Diagnosis not present

## 2018-02-19 DIAGNOSIS — I130 Hypertensive heart and chronic kidney disease with heart failure and stage 1 through stage 4 chronic kidney disease, or unspecified chronic kidney disease: Secondary | ICD-10-CM | POA: Diagnosis not present

## 2018-02-19 DIAGNOSIS — M7981 Nontraumatic hematoma of soft tissue: Secondary | ICD-10-CM | POA: Diagnosis not present

## 2018-02-19 DIAGNOSIS — I5020 Unspecified systolic (congestive) heart failure: Secondary | ICD-10-CM | POA: Diagnosis not present

## 2018-02-19 DIAGNOSIS — I2510 Atherosclerotic heart disease of native coronary artery without angina pectoris: Secondary | ICD-10-CM | POA: Diagnosis not present

## 2018-02-19 DIAGNOSIS — M109 Gout, unspecified: Secondary | ICD-10-CM | POA: Diagnosis not present

## 2018-02-19 DIAGNOSIS — T79A22D Traumatic compartment syndrome of left lower extremity, subsequent encounter: Secondary | ICD-10-CM | POA: Diagnosis not present

## 2018-02-19 DIAGNOSIS — M6281 Muscle weakness (generalized): Secondary | ICD-10-CM | POA: Diagnosis not present

## 2018-02-19 DIAGNOSIS — K219 Gastro-esophageal reflux disease without esophagitis: Secondary | ICD-10-CM | POA: Diagnosis not present

## 2018-02-21 DIAGNOSIS — T79A22D Traumatic compartment syndrome of left lower extremity, subsequent encounter: Secondary | ICD-10-CM | POA: Diagnosis not present

## 2018-02-21 DIAGNOSIS — N189 Chronic kidney disease, unspecified: Secondary | ICD-10-CM | POA: Diagnosis not present

## 2018-02-21 DIAGNOSIS — I130 Hypertensive heart and chronic kidney disease with heart failure and stage 1 through stage 4 chronic kidney disease, or unspecified chronic kidney disease: Secondary | ICD-10-CM | POA: Diagnosis not present

## 2018-02-21 DIAGNOSIS — I5020 Unspecified systolic (congestive) heart failure: Secondary | ICD-10-CM | POA: Diagnosis not present

## 2018-02-21 DIAGNOSIS — I82402 Acute embolism and thrombosis of unspecified deep veins of left lower extremity: Secondary | ICD-10-CM | POA: Diagnosis not present

## 2018-02-21 DIAGNOSIS — J449 Chronic obstructive pulmonary disease, unspecified: Secondary | ICD-10-CM | POA: Diagnosis not present

## 2018-02-25 ENCOUNTER — Ambulatory Visit: Payer: Medicare Other

## 2018-02-25 DIAGNOSIS — N189 Chronic kidney disease, unspecified: Secondary | ICD-10-CM | POA: Diagnosis not present

## 2018-02-25 DIAGNOSIS — J449 Chronic obstructive pulmonary disease, unspecified: Secondary | ICD-10-CM | POA: Diagnosis not present

## 2018-02-25 DIAGNOSIS — T79A22D Traumatic compartment syndrome of left lower extremity, subsequent encounter: Secondary | ICD-10-CM | POA: Diagnosis not present

## 2018-02-25 DIAGNOSIS — I130 Hypertensive heart and chronic kidney disease with heart failure and stage 1 through stage 4 chronic kidney disease, or unspecified chronic kidney disease: Secondary | ICD-10-CM | POA: Diagnosis not present

## 2018-02-25 DIAGNOSIS — I82402 Acute embolism and thrombosis of unspecified deep veins of left lower extremity: Secondary | ICD-10-CM | POA: Diagnosis not present

## 2018-02-25 DIAGNOSIS — I5020 Unspecified systolic (congestive) heart failure: Secondary | ICD-10-CM | POA: Diagnosis not present

## 2018-02-26 DIAGNOSIS — N189 Chronic kidney disease, unspecified: Secondary | ICD-10-CM | POA: Diagnosis not present

## 2018-02-26 DIAGNOSIS — I5020 Unspecified systolic (congestive) heart failure: Secondary | ICD-10-CM | POA: Diagnosis not present

## 2018-02-26 DIAGNOSIS — I130 Hypertensive heart and chronic kidney disease with heart failure and stage 1 through stage 4 chronic kidney disease, or unspecified chronic kidney disease: Secondary | ICD-10-CM | POA: Diagnosis not present

## 2018-02-26 DIAGNOSIS — T79A22D Traumatic compartment syndrome of left lower extremity, subsequent encounter: Secondary | ICD-10-CM | POA: Diagnosis not present

## 2018-02-26 DIAGNOSIS — J449 Chronic obstructive pulmonary disease, unspecified: Secondary | ICD-10-CM | POA: Diagnosis not present

## 2018-02-26 DIAGNOSIS — I82402 Acute embolism and thrombosis of unspecified deep veins of left lower extremity: Secondary | ICD-10-CM | POA: Diagnosis not present

## 2018-02-28 DIAGNOSIS — I82402 Acute embolism and thrombosis of unspecified deep veins of left lower extremity: Secondary | ICD-10-CM | POA: Diagnosis not present

## 2018-02-28 DIAGNOSIS — J449 Chronic obstructive pulmonary disease, unspecified: Secondary | ICD-10-CM | POA: Diagnosis not present

## 2018-02-28 DIAGNOSIS — I130 Hypertensive heart and chronic kidney disease with heart failure and stage 1 through stage 4 chronic kidney disease, or unspecified chronic kidney disease: Secondary | ICD-10-CM | POA: Diagnosis not present

## 2018-02-28 DIAGNOSIS — T79A22D Traumatic compartment syndrome of left lower extremity, subsequent encounter: Secondary | ICD-10-CM | POA: Diagnosis not present

## 2018-02-28 DIAGNOSIS — I5020 Unspecified systolic (congestive) heart failure: Secondary | ICD-10-CM | POA: Diagnosis not present

## 2018-02-28 DIAGNOSIS — N189 Chronic kidney disease, unspecified: Secondary | ICD-10-CM | POA: Diagnosis not present

## 2018-03-02 DIAGNOSIS — T79A22D Traumatic compartment syndrome of left lower extremity, subsequent encounter: Secondary | ICD-10-CM | POA: Diagnosis not present

## 2018-03-02 DIAGNOSIS — I130 Hypertensive heart and chronic kidney disease with heart failure and stage 1 through stage 4 chronic kidney disease, or unspecified chronic kidney disease: Secondary | ICD-10-CM | POA: Diagnosis not present

## 2018-03-02 DIAGNOSIS — I82402 Acute embolism and thrombosis of unspecified deep veins of left lower extremity: Secondary | ICD-10-CM | POA: Diagnosis not present

## 2018-03-02 DIAGNOSIS — J449 Chronic obstructive pulmonary disease, unspecified: Secondary | ICD-10-CM | POA: Diagnosis not present

## 2018-03-02 DIAGNOSIS — I5020 Unspecified systolic (congestive) heart failure: Secondary | ICD-10-CM | POA: Diagnosis not present

## 2018-03-02 DIAGNOSIS — N189 Chronic kidney disease, unspecified: Secondary | ICD-10-CM | POA: Diagnosis not present

## 2018-03-04 DIAGNOSIS — T79A22D Traumatic compartment syndrome of left lower extremity, subsequent encounter: Secondary | ICD-10-CM | POA: Diagnosis not present

## 2018-03-04 DIAGNOSIS — N189 Chronic kidney disease, unspecified: Secondary | ICD-10-CM | POA: Diagnosis not present

## 2018-03-04 DIAGNOSIS — I130 Hypertensive heart and chronic kidney disease with heart failure and stage 1 through stage 4 chronic kidney disease, or unspecified chronic kidney disease: Secondary | ICD-10-CM | POA: Diagnosis not present

## 2018-03-04 DIAGNOSIS — J449 Chronic obstructive pulmonary disease, unspecified: Secondary | ICD-10-CM | POA: Diagnosis not present

## 2018-03-04 DIAGNOSIS — I82402 Acute embolism and thrombosis of unspecified deep veins of left lower extremity: Secondary | ICD-10-CM | POA: Diagnosis not present

## 2018-03-04 DIAGNOSIS — I5020 Unspecified systolic (congestive) heart failure: Secondary | ICD-10-CM | POA: Diagnosis not present

## 2018-03-07 DIAGNOSIS — I82402 Acute embolism and thrombosis of unspecified deep veins of left lower extremity: Secondary | ICD-10-CM | POA: Diagnosis not present

## 2018-03-07 DIAGNOSIS — T79A22D Traumatic compartment syndrome of left lower extremity, subsequent encounter: Secondary | ICD-10-CM | POA: Diagnosis not present

## 2018-03-07 DIAGNOSIS — I130 Hypertensive heart and chronic kidney disease with heart failure and stage 1 through stage 4 chronic kidney disease, or unspecified chronic kidney disease: Secondary | ICD-10-CM | POA: Diagnosis not present

## 2018-03-07 DIAGNOSIS — I5020 Unspecified systolic (congestive) heart failure: Secondary | ICD-10-CM | POA: Diagnosis not present

## 2018-03-07 DIAGNOSIS — J449 Chronic obstructive pulmonary disease, unspecified: Secondary | ICD-10-CM | POA: Diagnosis not present

## 2018-03-07 DIAGNOSIS — N189 Chronic kidney disease, unspecified: Secondary | ICD-10-CM | POA: Diagnosis not present

## 2018-03-09 DIAGNOSIS — J449 Chronic obstructive pulmonary disease, unspecified: Secondary | ICD-10-CM | POA: Diagnosis not present

## 2018-03-09 DIAGNOSIS — I5020 Unspecified systolic (congestive) heart failure: Secondary | ICD-10-CM | POA: Diagnosis not present

## 2018-03-09 DIAGNOSIS — I82402 Acute embolism and thrombosis of unspecified deep veins of left lower extremity: Secondary | ICD-10-CM | POA: Diagnosis not present

## 2018-03-09 DIAGNOSIS — T79A22D Traumatic compartment syndrome of left lower extremity, subsequent encounter: Secondary | ICD-10-CM | POA: Diagnosis not present

## 2018-03-09 DIAGNOSIS — N189 Chronic kidney disease, unspecified: Secondary | ICD-10-CM | POA: Diagnosis not present

## 2018-03-09 DIAGNOSIS — I130 Hypertensive heart and chronic kidney disease with heart failure and stage 1 through stage 4 chronic kidney disease, or unspecified chronic kidney disease: Secondary | ICD-10-CM | POA: Diagnosis not present

## 2018-03-10 DIAGNOSIS — T79A22D Traumatic compartment syndrome of left lower extremity, subsequent encounter: Secondary | ICD-10-CM | POA: Diagnosis not present

## 2018-03-10 DIAGNOSIS — I5020 Unspecified systolic (congestive) heart failure: Secondary | ICD-10-CM | POA: Diagnosis not present

## 2018-03-10 DIAGNOSIS — I130 Hypertensive heart and chronic kidney disease with heart failure and stage 1 through stage 4 chronic kidney disease, or unspecified chronic kidney disease: Secondary | ICD-10-CM | POA: Diagnosis not present

## 2018-03-10 DIAGNOSIS — I82402 Acute embolism and thrombosis of unspecified deep veins of left lower extremity: Secondary | ICD-10-CM | POA: Diagnosis not present

## 2018-03-10 DIAGNOSIS — J449 Chronic obstructive pulmonary disease, unspecified: Secondary | ICD-10-CM | POA: Diagnosis not present

## 2018-03-10 DIAGNOSIS — N189 Chronic kidney disease, unspecified: Secondary | ICD-10-CM | POA: Diagnosis not present

## 2018-03-11 DIAGNOSIS — J449 Chronic obstructive pulmonary disease, unspecified: Secondary | ICD-10-CM | POA: Diagnosis not present

## 2018-03-11 DIAGNOSIS — N189 Chronic kidney disease, unspecified: Secondary | ICD-10-CM | POA: Diagnosis not present

## 2018-03-11 DIAGNOSIS — I82402 Acute embolism and thrombosis of unspecified deep veins of left lower extremity: Secondary | ICD-10-CM | POA: Diagnosis not present

## 2018-03-11 DIAGNOSIS — I5020 Unspecified systolic (congestive) heart failure: Secondary | ICD-10-CM | POA: Diagnosis not present

## 2018-03-11 DIAGNOSIS — I130 Hypertensive heart and chronic kidney disease with heart failure and stage 1 through stage 4 chronic kidney disease, or unspecified chronic kidney disease: Secondary | ICD-10-CM | POA: Diagnosis not present

## 2018-03-11 DIAGNOSIS — T79A22D Traumatic compartment syndrome of left lower extremity, subsequent encounter: Secondary | ICD-10-CM | POA: Diagnosis not present

## 2018-03-14 DIAGNOSIS — J449 Chronic obstructive pulmonary disease, unspecified: Secondary | ICD-10-CM | POA: Diagnosis not present

## 2018-03-14 DIAGNOSIS — I130 Hypertensive heart and chronic kidney disease with heart failure and stage 1 through stage 4 chronic kidney disease, or unspecified chronic kidney disease: Secondary | ICD-10-CM | POA: Diagnosis not present

## 2018-03-14 DIAGNOSIS — N189 Chronic kidney disease, unspecified: Secondary | ICD-10-CM | POA: Diagnosis not present

## 2018-03-14 DIAGNOSIS — I5020 Unspecified systolic (congestive) heart failure: Secondary | ICD-10-CM | POA: Diagnosis not present

## 2018-03-14 DIAGNOSIS — I82402 Acute embolism and thrombosis of unspecified deep veins of left lower extremity: Secondary | ICD-10-CM | POA: Diagnosis not present

## 2018-03-14 DIAGNOSIS — T79A22D Traumatic compartment syndrome of left lower extremity, subsequent encounter: Secondary | ICD-10-CM | POA: Diagnosis not present

## 2018-03-16 DIAGNOSIS — J449 Chronic obstructive pulmonary disease, unspecified: Secondary | ICD-10-CM | POA: Diagnosis not present

## 2018-03-16 DIAGNOSIS — I130 Hypertensive heart and chronic kidney disease with heart failure and stage 1 through stage 4 chronic kidney disease, or unspecified chronic kidney disease: Secondary | ICD-10-CM | POA: Diagnosis not present

## 2018-03-16 DIAGNOSIS — I82402 Acute embolism and thrombosis of unspecified deep veins of left lower extremity: Secondary | ICD-10-CM | POA: Diagnosis not present

## 2018-03-16 DIAGNOSIS — T79A22D Traumatic compartment syndrome of left lower extremity, subsequent encounter: Secondary | ICD-10-CM | POA: Diagnosis not present

## 2018-03-16 DIAGNOSIS — I5020 Unspecified systolic (congestive) heart failure: Secondary | ICD-10-CM | POA: Diagnosis not present

## 2018-03-16 DIAGNOSIS — N189 Chronic kidney disease, unspecified: Secondary | ICD-10-CM | POA: Diagnosis not present

## 2018-03-18 DIAGNOSIS — I130 Hypertensive heart and chronic kidney disease with heart failure and stage 1 through stage 4 chronic kidney disease, or unspecified chronic kidney disease: Secondary | ICD-10-CM | POA: Diagnosis not present

## 2018-03-18 DIAGNOSIS — I5020 Unspecified systolic (congestive) heart failure: Secondary | ICD-10-CM | POA: Diagnosis not present

## 2018-03-18 DIAGNOSIS — T79A22D Traumatic compartment syndrome of left lower extremity, subsequent encounter: Secondary | ICD-10-CM | POA: Diagnosis not present

## 2018-03-18 DIAGNOSIS — I82402 Acute embolism and thrombosis of unspecified deep veins of left lower extremity: Secondary | ICD-10-CM | POA: Diagnosis not present

## 2018-03-18 DIAGNOSIS — J449 Chronic obstructive pulmonary disease, unspecified: Secondary | ICD-10-CM | POA: Diagnosis not present

## 2018-03-18 DIAGNOSIS — N189 Chronic kidney disease, unspecified: Secondary | ICD-10-CM | POA: Diagnosis not present

## 2018-03-21 DIAGNOSIS — J449 Chronic obstructive pulmonary disease, unspecified: Secondary | ICD-10-CM | POA: Diagnosis not present

## 2018-03-21 DIAGNOSIS — E785 Hyperlipidemia, unspecified: Secondary | ICD-10-CM | POA: Diagnosis not present

## 2018-03-21 DIAGNOSIS — K219 Gastro-esophageal reflux disease without esophagitis: Secondary | ICD-10-CM | POA: Diagnosis not present

## 2018-03-21 DIAGNOSIS — T79A22D Traumatic compartment syndrome of left lower extremity, subsequent encounter: Secondary | ICD-10-CM | POA: Diagnosis not present

## 2018-03-21 DIAGNOSIS — M6281 Muscle weakness (generalized): Secondary | ICD-10-CM | POA: Diagnosis not present

## 2018-03-21 DIAGNOSIS — I130 Hypertensive heart and chronic kidney disease with heart failure and stage 1 through stage 4 chronic kidney disease, or unspecified chronic kidney disease: Secondary | ICD-10-CM | POA: Diagnosis not present

## 2018-03-21 DIAGNOSIS — I5020 Unspecified systolic (congestive) heart failure: Secondary | ICD-10-CM | POA: Diagnosis not present

## 2018-03-21 DIAGNOSIS — I82402 Acute embolism and thrombosis of unspecified deep veins of left lower extremity: Secondary | ICD-10-CM | POA: Diagnosis not present

## 2018-03-21 DIAGNOSIS — Z95810 Presence of automatic (implantable) cardiac defibrillator: Secondary | ICD-10-CM | POA: Diagnosis not present

## 2018-03-21 DIAGNOSIS — I482 Chronic atrial fibrillation, unspecified: Secondary | ICD-10-CM | POA: Diagnosis not present

## 2018-03-21 DIAGNOSIS — I2510 Atherosclerotic heart disease of native coronary artery without angina pectoris: Secondary | ICD-10-CM | POA: Diagnosis not present

## 2018-03-21 DIAGNOSIS — N189 Chronic kidney disease, unspecified: Secondary | ICD-10-CM | POA: Diagnosis not present

## 2018-03-21 DIAGNOSIS — M109 Gout, unspecified: Secondary | ICD-10-CM | POA: Diagnosis not present

## 2018-03-21 DIAGNOSIS — Z7901 Long term (current) use of anticoagulants: Secondary | ICD-10-CM | POA: Diagnosis not present

## 2018-03-21 DIAGNOSIS — M7981 Nontraumatic hematoma of soft tissue: Secondary | ICD-10-CM | POA: Diagnosis not present

## 2018-03-21 DIAGNOSIS — E039 Hypothyroidism, unspecified: Secondary | ICD-10-CM | POA: Diagnosis not present

## 2018-03-22 DIAGNOSIS — N189 Chronic kidney disease, unspecified: Secondary | ICD-10-CM | POA: Diagnosis not present

## 2018-03-22 DIAGNOSIS — I82402 Acute embolism and thrombosis of unspecified deep veins of left lower extremity: Secondary | ICD-10-CM | POA: Diagnosis not present

## 2018-03-22 DIAGNOSIS — I5020 Unspecified systolic (congestive) heart failure: Secondary | ICD-10-CM | POA: Diagnosis not present

## 2018-03-22 DIAGNOSIS — I130 Hypertensive heart and chronic kidney disease with heart failure and stage 1 through stage 4 chronic kidney disease, or unspecified chronic kidney disease: Secondary | ICD-10-CM | POA: Diagnosis not present

## 2018-03-22 DIAGNOSIS — T79A22D Traumatic compartment syndrome of left lower extremity, subsequent encounter: Secondary | ICD-10-CM | POA: Diagnosis not present

## 2018-03-22 DIAGNOSIS — J449 Chronic obstructive pulmonary disease, unspecified: Secondary | ICD-10-CM | POA: Diagnosis not present

## 2018-03-25 DIAGNOSIS — I82402 Acute embolism and thrombosis of unspecified deep veins of left lower extremity: Secondary | ICD-10-CM | POA: Diagnosis not present

## 2018-03-25 DIAGNOSIS — I5020 Unspecified systolic (congestive) heart failure: Secondary | ICD-10-CM | POA: Diagnosis not present

## 2018-03-25 DIAGNOSIS — T79A22D Traumatic compartment syndrome of left lower extremity, subsequent encounter: Secondary | ICD-10-CM | POA: Diagnosis not present

## 2018-03-25 DIAGNOSIS — N189 Chronic kidney disease, unspecified: Secondary | ICD-10-CM | POA: Diagnosis not present

## 2018-03-25 DIAGNOSIS — J449 Chronic obstructive pulmonary disease, unspecified: Secondary | ICD-10-CM | POA: Diagnosis not present

## 2018-03-25 DIAGNOSIS — I130 Hypertensive heart and chronic kidney disease with heart failure and stage 1 through stage 4 chronic kidney disease, or unspecified chronic kidney disease: Secondary | ICD-10-CM | POA: Diagnosis not present

## 2018-03-26 DIAGNOSIS — I82402 Acute embolism and thrombosis of unspecified deep veins of left lower extremity: Secondary | ICD-10-CM | POA: Diagnosis not present

## 2018-03-26 DIAGNOSIS — J449 Chronic obstructive pulmonary disease, unspecified: Secondary | ICD-10-CM | POA: Diagnosis not present

## 2018-03-26 DIAGNOSIS — N189 Chronic kidney disease, unspecified: Secondary | ICD-10-CM | POA: Diagnosis not present

## 2018-03-26 DIAGNOSIS — T79A22D Traumatic compartment syndrome of left lower extremity, subsequent encounter: Secondary | ICD-10-CM | POA: Diagnosis not present

## 2018-03-26 DIAGNOSIS — I5020 Unspecified systolic (congestive) heart failure: Secondary | ICD-10-CM | POA: Diagnosis not present

## 2018-03-26 DIAGNOSIS — I130 Hypertensive heart and chronic kidney disease with heart failure and stage 1 through stage 4 chronic kidney disease, or unspecified chronic kidney disease: Secondary | ICD-10-CM | POA: Diagnosis not present

## 2018-03-29 DIAGNOSIS — N189 Chronic kidney disease, unspecified: Secondary | ICD-10-CM | POA: Diagnosis not present

## 2018-03-29 DIAGNOSIS — J449 Chronic obstructive pulmonary disease, unspecified: Secondary | ICD-10-CM | POA: Diagnosis not present

## 2018-03-29 DIAGNOSIS — I130 Hypertensive heart and chronic kidney disease with heart failure and stage 1 through stage 4 chronic kidney disease, or unspecified chronic kidney disease: Secondary | ICD-10-CM | POA: Diagnosis not present

## 2018-03-29 DIAGNOSIS — I82402 Acute embolism and thrombosis of unspecified deep veins of left lower extremity: Secondary | ICD-10-CM | POA: Diagnosis not present

## 2018-03-29 DIAGNOSIS — T79A22D Traumatic compartment syndrome of left lower extremity, subsequent encounter: Secondary | ICD-10-CM | POA: Diagnosis not present

## 2018-03-29 DIAGNOSIS — I5020 Unspecified systolic (congestive) heart failure: Secondary | ICD-10-CM | POA: Diagnosis not present

## 2018-03-31 DIAGNOSIS — I130 Hypertensive heart and chronic kidney disease with heart failure and stage 1 through stage 4 chronic kidney disease, or unspecified chronic kidney disease: Secondary | ICD-10-CM | POA: Diagnosis not present

## 2018-03-31 DIAGNOSIS — N189 Chronic kidney disease, unspecified: Secondary | ICD-10-CM | POA: Diagnosis not present

## 2018-03-31 DIAGNOSIS — I82402 Acute embolism and thrombosis of unspecified deep veins of left lower extremity: Secondary | ICD-10-CM | POA: Diagnosis not present

## 2018-03-31 DIAGNOSIS — I5020 Unspecified systolic (congestive) heart failure: Secondary | ICD-10-CM | POA: Diagnosis not present

## 2018-03-31 DIAGNOSIS — T79A22D Traumatic compartment syndrome of left lower extremity, subsequent encounter: Secondary | ICD-10-CM | POA: Diagnosis not present

## 2018-03-31 DIAGNOSIS — J449 Chronic obstructive pulmonary disease, unspecified: Secondary | ICD-10-CM | POA: Diagnosis not present

## 2018-04-05 DIAGNOSIS — T79A22D Traumatic compartment syndrome of left lower extremity, subsequent encounter: Secondary | ICD-10-CM | POA: Diagnosis not present

## 2018-04-05 DIAGNOSIS — N189 Chronic kidney disease, unspecified: Secondary | ICD-10-CM | POA: Diagnosis not present

## 2018-04-05 DIAGNOSIS — I82402 Acute embolism and thrombosis of unspecified deep veins of left lower extremity: Secondary | ICD-10-CM | POA: Diagnosis not present

## 2018-04-05 DIAGNOSIS — I5020 Unspecified systolic (congestive) heart failure: Secondary | ICD-10-CM | POA: Diagnosis not present

## 2018-04-05 DIAGNOSIS — J449 Chronic obstructive pulmonary disease, unspecified: Secondary | ICD-10-CM | POA: Diagnosis not present

## 2018-04-05 DIAGNOSIS — I130 Hypertensive heart and chronic kidney disease with heart failure and stage 1 through stage 4 chronic kidney disease, or unspecified chronic kidney disease: Secondary | ICD-10-CM | POA: Diagnosis not present

## 2018-04-07 DIAGNOSIS — I5020 Unspecified systolic (congestive) heart failure: Secondary | ICD-10-CM | POA: Diagnosis not present

## 2018-04-07 DIAGNOSIS — I82402 Acute embolism and thrombosis of unspecified deep veins of left lower extremity: Secondary | ICD-10-CM | POA: Diagnosis not present

## 2018-04-07 DIAGNOSIS — J449 Chronic obstructive pulmonary disease, unspecified: Secondary | ICD-10-CM | POA: Diagnosis not present

## 2018-04-07 DIAGNOSIS — N189 Chronic kidney disease, unspecified: Secondary | ICD-10-CM | POA: Diagnosis not present

## 2018-04-07 DIAGNOSIS — T79A22D Traumatic compartment syndrome of left lower extremity, subsequent encounter: Secondary | ICD-10-CM | POA: Diagnosis not present

## 2018-04-07 DIAGNOSIS — I130 Hypertensive heart and chronic kidney disease with heart failure and stage 1 through stage 4 chronic kidney disease, or unspecified chronic kidney disease: Secondary | ICD-10-CM | POA: Diagnosis not present

## 2018-04-12 DIAGNOSIS — N189 Chronic kidney disease, unspecified: Secondary | ICD-10-CM | POA: Diagnosis not present

## 2018-04-12 DIAGNOSIS — I82402 Acute embolism and thrombosis of unspecified deep veins of left lower extremity: Secondary | ICD-10-CM | POA: Diagnosis not present

## 2018-04-12 DIAGNOSIS — T79A22D Traumatic compartment syndrome of left lower extremity, subsequent encounter: Secondary | ICD-10-CM | POA: Diagnosis not present

## 2018-04-12 DIAGNOSIS — I130 Hypertensive heart and chronic kidney disease with heart failure and stage 1 through stage 4 chronic kidney disease, or unspecified chronic kidney disease: Secondary | ICD-10-CM | POA: Diagnosis not present

## 2018-04-12 DIAGNOSIS — J449 Chronic obstructive pulmonary disease, unspecified: Secondary | ICD-10-CM | POA: Diagnosis not present

## 2018-04-12 DIAGNOSIS — I5020 Unspecified systolic (congestive) heart failure: Secondary | ICD-10-CM | POA: Diagnosis not present

## 2018-04-14 DIAGNOSIS — J449 Chronic obstructive pulmonary disease, unspecified: Secondary | ICD-10-CM | POA: Diagnosis not present

## 2018-04-14 DIAGNOSIS — I5020 Unspecified systolic (congestive) heart failure: Secondary | ICD-10-CM | POA: Diagnosis not present

## 2018-04-14 DIAGNOSIS — T79A22D Traumatic compartment syndrome of left lower extremity, subsequent encounter: Secondary | ICD-10-CM | POA: Diagnosis not present

## 2018-04-14 DIAGNOSIS — I130 Hypertensive heart and chronic kidney disease with heart failure and stage 1 through stage 4 chronic kidney disease, or unspecified chronic kidney disease: Secondary | ICD-10-CM | POA: Diagnosis not present

## 2018-04-14 DIAGNOSIS — N189 Chronic kidney disease, unspecified: Secondary | ICD-10-CM | POA: Diagnosis not present

## 2018-04-14 DIAGNOSIS — I82402 Acute embolism and thrombosis of unspecified deep veins of left lower extremity: Secondary | ICD-10-CM | POA: Diagnosis not present

## 2018-04-19 DIAGNOSIS — I5020 Unspecified systolic (congestive) heart failure: Secondary | ICD-10-CM | POA: Diagnosis not present

## 2018-04-19 DIAGNOSIS — N189 Chronic kidney disease, unspecified: Secondary | ICD-10-CM | POA: Diagnosis not present

## 2018-04-19 DIAGNOSIS — I130 Hypertensive heart and chronic kidney disease with heart failure and stage 1 through stage 4 chronic kidney disease, or unspecified chronic kidney disease: Secondary | ICD-10-CM | POA: Diagnosis not present

## 2018-04-19 DIAGNOSIS — T79A22D Traumatic compartment syndrome of left lower extremity, subsequent encounter: Secondary | ICD-10-CM | POA: Diagnosis not present

## 2018-04-19 DIAGNOSIS — J449 Chronic obstructive pulmonary disease, unspecified: Secondary | ICD-10-CM | POA: Diagnosis not present

## 2018-04-19 DIAGNOSIS — I82402 Acute embolism and thrombosis of unspecified deep veins of left lower extremity: Secondary | ICD-10-CM | POA: Diagnosis not present

## 2018-12-02 DIAGNOSIS — Z23 Encounter for immunization: Secondary | ICD-10-CM | POA: Diagnosis not present

## 2019-01-10 DIAGNOSIS — J181 Lobar pneumonia, unspecified organism: Secondary | ICD-10-CM | POA: Diagnosis not present

## 2019-01-10 DIAGNOSIS — R05 Cough: Secondary | ICD-10-CM | POA: Diagnosis not present

## 2019-01-10 DIAGNOSIS — I480 Paroxysmal atrial fibrillation: Secondary | ICD-10-CM | POA: Diagnosis not present

## 2019-01-10 DIAGNOSIS — G8929 Other chronic pain: Secondary | ICD-10-CM | POA: Diagnosis not present

## 2019-01-10 DIAGNOSIS — J449 Chronic obstructive pulmonary disease, unspecified: Secondary | ICD-10-CM | POA: Diagnosis not present

## 2019-01-26 ENCOUNTER — Other Ambulatory Visit: Payer: Self-pay | Admitting: Internal Medicine

## 2019-01-26 DIAGNOSIS — J1100 Influenza due to unidentified influenza virus with unspecified type of pneumonia: Secondary | ICD-10-CM

## 2019-02-07 DIAGNOSIS — Z7901 Long term (current) use of anticoagulants: Secondary | ICD-10-CM | POA: Diagnosis not present

## 2019-02-07 DIAGNOSIS — I10 Essential (primary) hypertension: Secondary | ICD-10-CM | POA: Diagnosis not present

## 2019-02-07 DIAGNOSIS — Z950 Presence of cardiac pacemaker: Secondary | ICD-10-CM | POA: Diagnosis not present

## 2019-02-07 DIAGNOSIS — N3001 Acute cystitis with hematuria: Secondary | ICD-10-CM | POA: Diagnosis not present

## 2019-02-07 DIAGNOSIS — Z79899 Other long term (current) drug therapy: Secondary | ICD-10-CM | POA: Diagnosis not present

## 2019-02-07 DIAGNOSIS — J449 Chronic obstructive pulmonary disease, unspecified: Secondary | ICD-10-CM | POA: Diagnosis not present

## 2019-02-07 DIAGNOSIS — Z87891 Personal history of nicotine dependence: Secondary | ICD-10-CM | POA: Diagnosis not present

## 2019-02-07 DIAGNOSIS — Z951 Presence of aortocoronary bypass graft: Secondary | ICD-10-CM | POA: Diagnosis not present

## 2019-02-07 DIAGNOSIS — I25810 Atherosclerosis of coronary artery bypass graft(s) without angina pectoris: Secondary | ICD-10-CM | POA: Diagnosis not present

## 2019-02-07 DIAGNOSIS — Z7982 Long term (current) use of aspirin: Secondary | ICD-10-CM | POA: Diagnosis not present

## 2019-02-07 DIAGNOSIS — E785 Hyperlipidemia, unspecified: Secondary | ICD-10-CM | POA: Diagnosis not present

## 2019-02-07 DIAGNOSIS — D735 Infarction of spleen: Secondary | ICD-10-CM | POA: Diagnosis not present

## 2019-02-07 DIAGNOSIS — M5432 Sciatica, left side: Secondary | ICD-10-CM | POA: Diagnosis not present

## 2019-04-10 DEATH — deceased

## 2019-07-26 IMAGING — CT CT EXTREM LOW W/O CM*L*
2 of 3 series · 12 of 33 positions shown, 15 images · non-contrast
Comparison: None.

CLINICAL DATA: Left lower extremity cellulitis. Evaluate for
necrotizing fasciitis.

EXAM:
CT OF THE LOWER LEFT EXTREMITY WITHOUT CONTRAST
TECHNIQUE: Multidetector CT imaging of the lower left extremity was performed
according to the standard protocol.

[Series 4: lfov ext 3.0 b40s · axial · 0.55mm/px · z∈[+116,+642]mm · 9 of 207 slices shown, 12 images]
[im 16/207  soft-tissue]
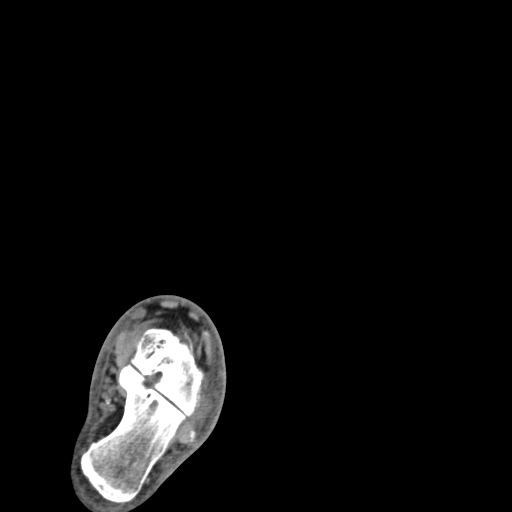
[im 16/207  bone]
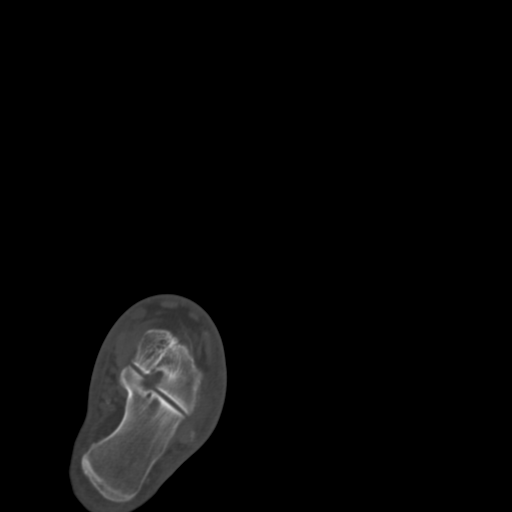
[im 48/207  bone]
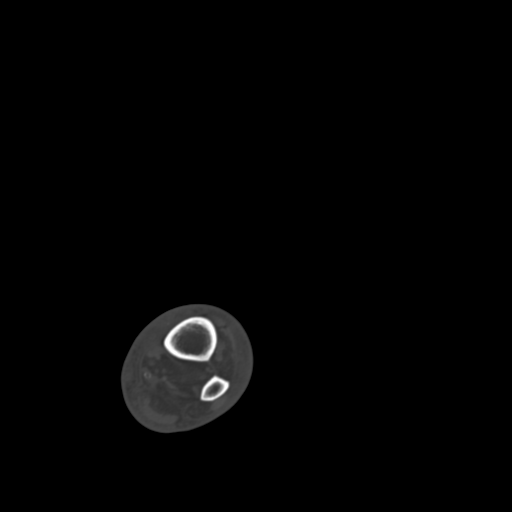
[im 64/207  bone]
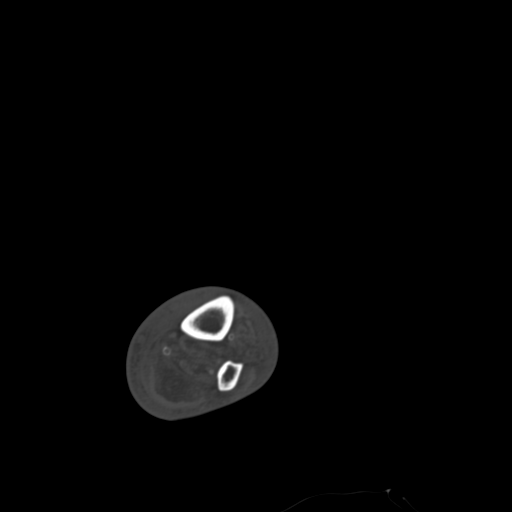
[im 80/207  bone]
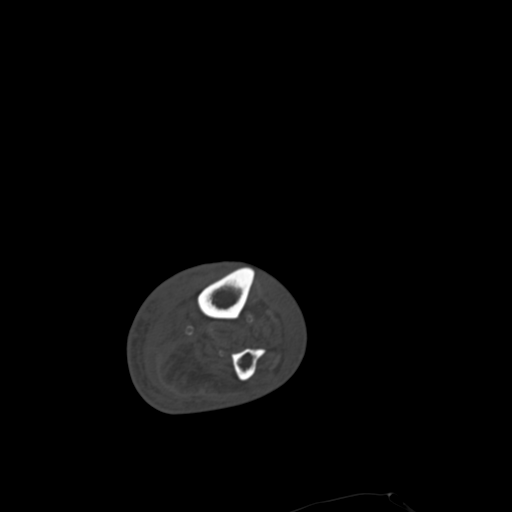
[im 111/207  soft-tissue]
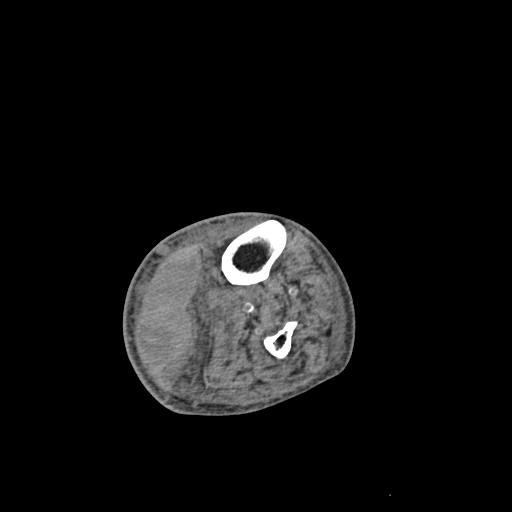
[im 111/207  bone]
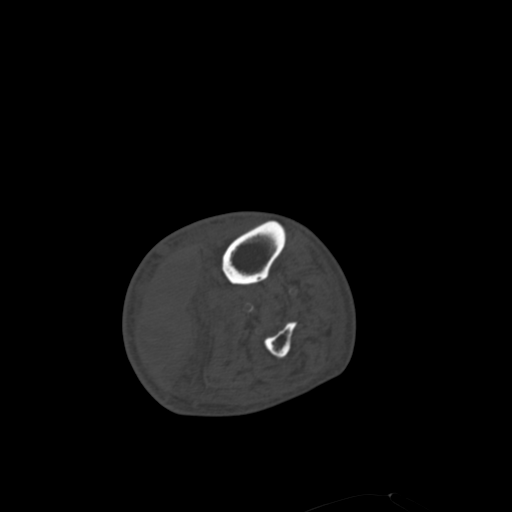
[im 127/207  bone]
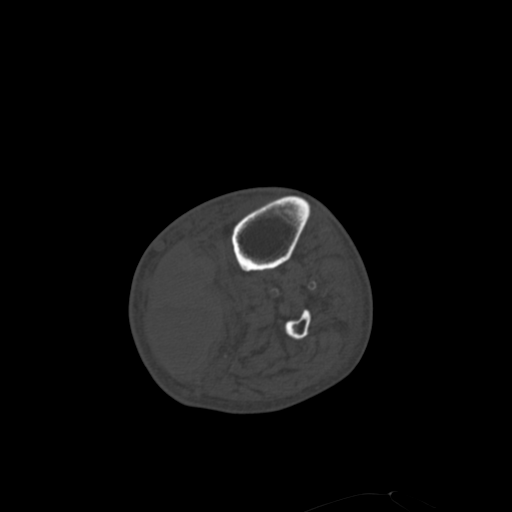
[im 143/207  bone]
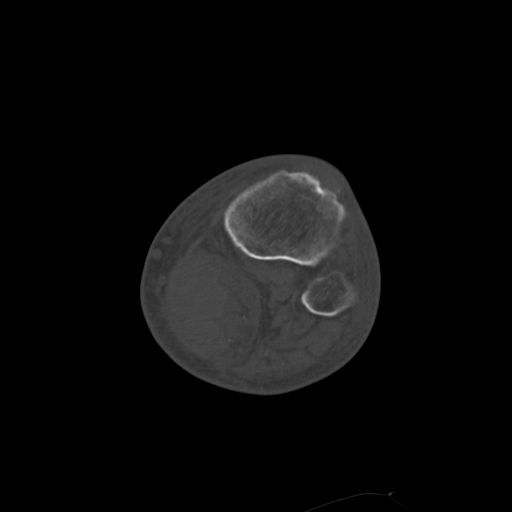
[im 175/207  bone]
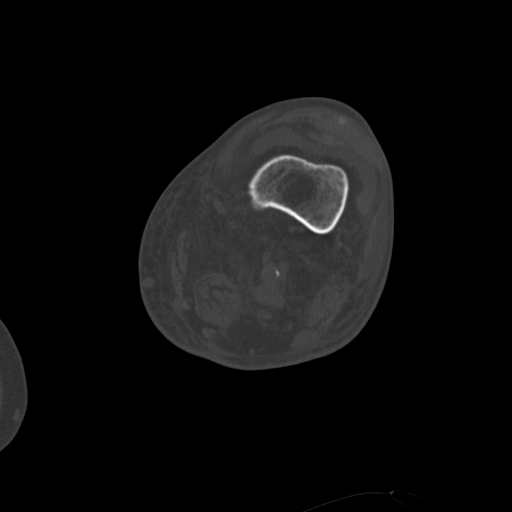
[im 191/207  soft-tissue]
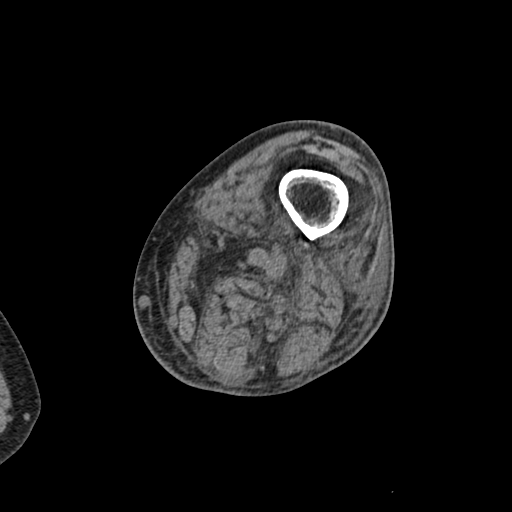
[im 191/207  bone]
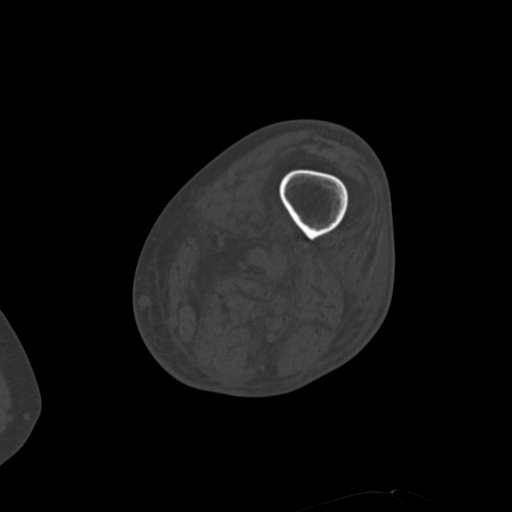

[Series 7: coronalsoft tissue · coronal · 0.49mm/px · 3 of 126 slices shown]
[im 26/126  bone]
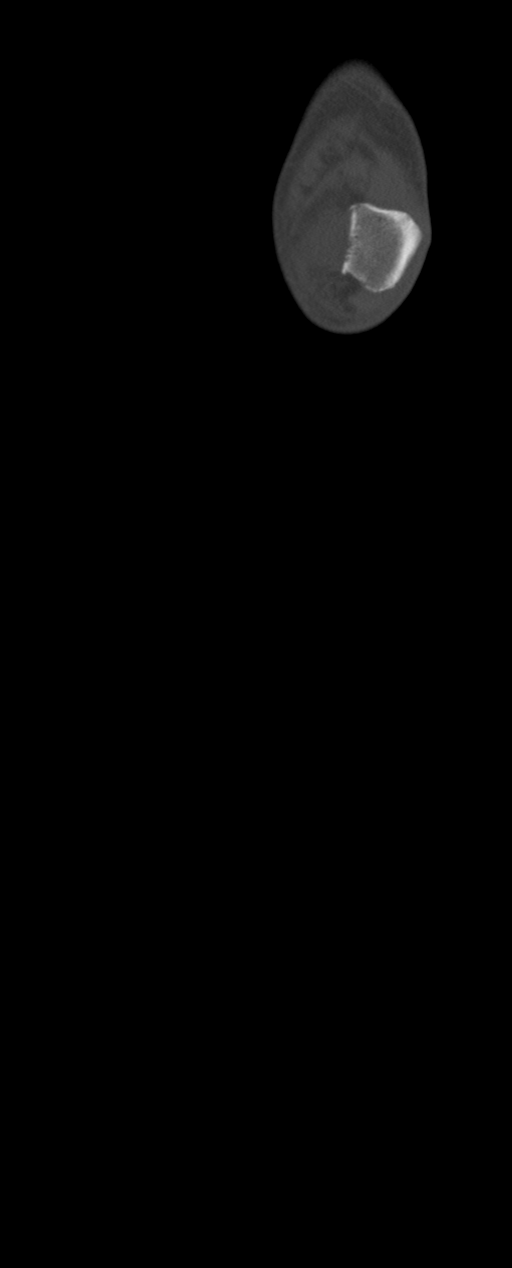
[im 51/126  bone]
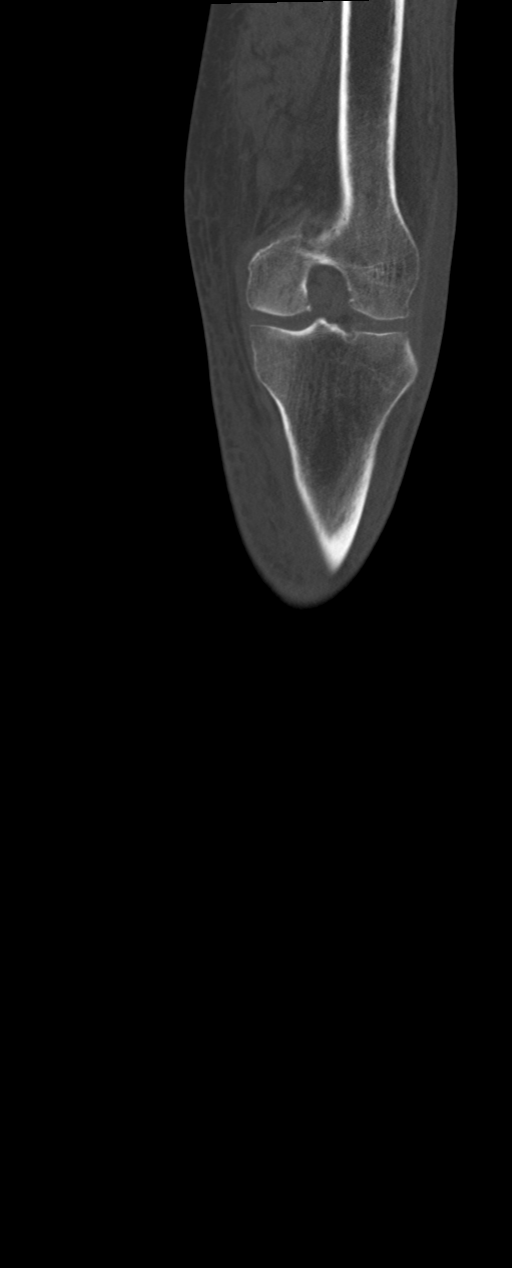
[im 76/126  bone]
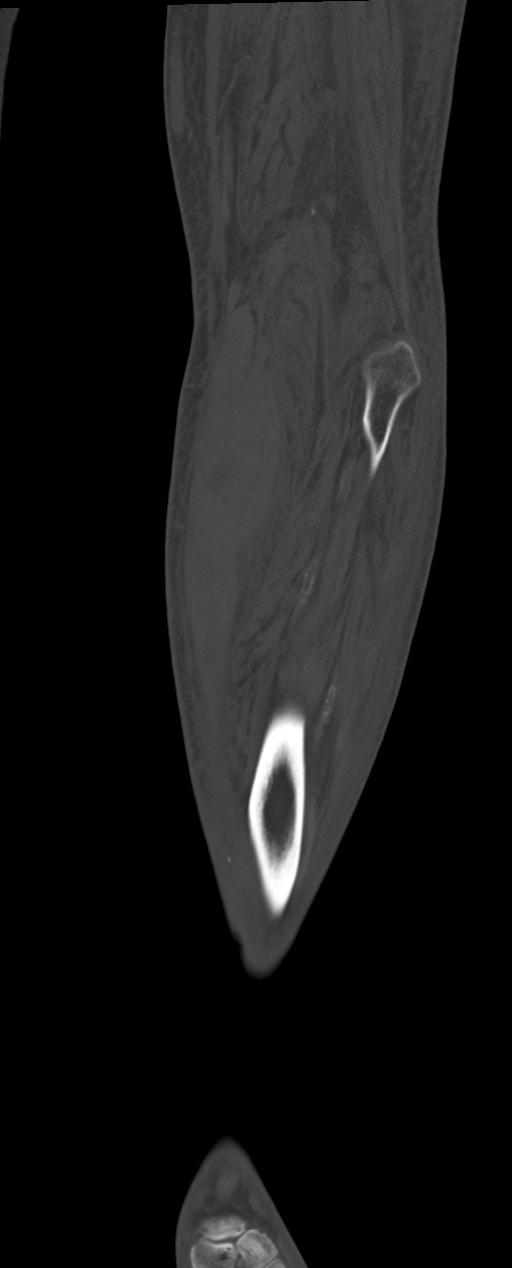

[12 of 33 positions shown; findings below may reference images not displayed]

FINDINGS: Bones/Joint/Cartilage

No fracture or dislocation. Normal alignment. High-density joint
effusion concerning for hemarthrosis. Mild medial femorotibial
compartment joint space narrowing. Mild lateral patellofemoral
compartment joint space narrowing. No periosteal reaction or bone
destruction. No aggressive osseous lesion.

Mild osteoarthritis of the subtalar joints.

Ligaments

Ligaments are suboptimally evaluated by CT.

Muscles and Tendons
Flexor, extensor, peroneal and Achilles tendons are grossly intact.
Patellar tendon and quadriceps tendon are grossly intact. 7.3 x
x 17 cm heterogeneous hyperdense fluid collection in the medial
gastrocnemius muscle most consistent with an intramuscular hematoma.
Mild generalized muscle atrophy.

Soft tissue
No fluid collection or hematoma. No soft tissue mass. Soft tissue
edema circumferentially in the subcutaneous fat around left lower
leg. No soft tissue emphysema. No radiopaque foreign body.
Peripheral vascular atherosclerotic disease.
IMPRESSION: 1. Generalized soft tissue edema in the subcutaneous fat
circumferentially around the left lower leg as can be seen with
cellulitis versus venous insufficiency.
2. 7.3 x 4.2 x 17 cm intramuscular hematoma in the medial
gastrocnemius muscle.
3. No soft tissue emphysema to suggest necrotizing infection.
4. Small hemarthrosis.

## 2019-07-26 IMAGING — CT CT CHEST W/O CM
2 of 3 series · 15 of 36 positions shown, 18 images · non-contrast
Comparison: Partial comparison to CTA abdomen/pelvis/runoff dated
01/09/2018

CLINICAL DATA: Lung mass on CT runoff

EXAM:
CT CHEST WITHOUT CONTRAST
TECHNIQUE: Multidetector CT imaging of the chest was performed following the
standard protocol without IV contrast.

[Series 4: thorax 2.0 · axial · 0.88mm/px · z∈[+1294,+1534]mm · 12 of 142 slices shown, 15 images]
[im 11/142  mediastinal]
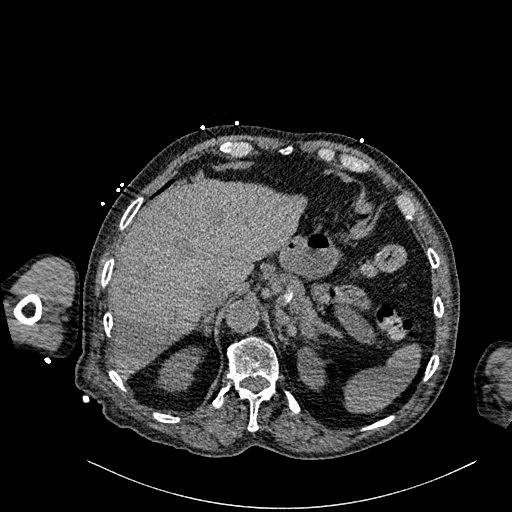
[im 11/142  lung]
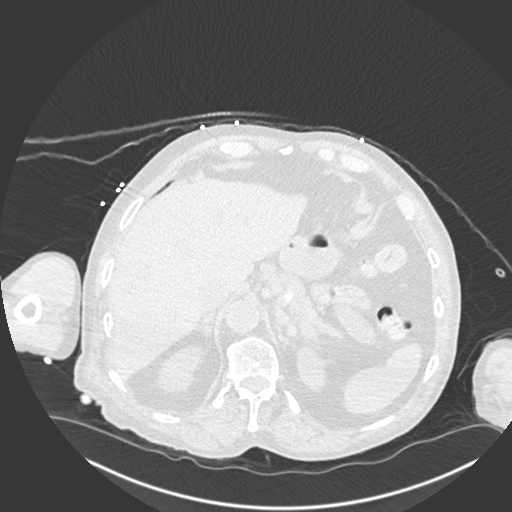
[im 21/142  lung]
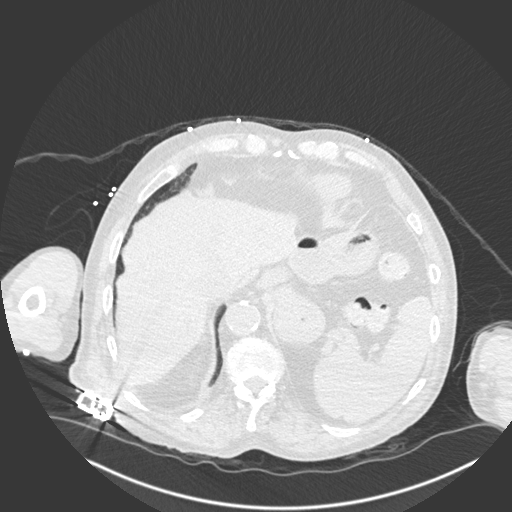
[im 32/142  lung]
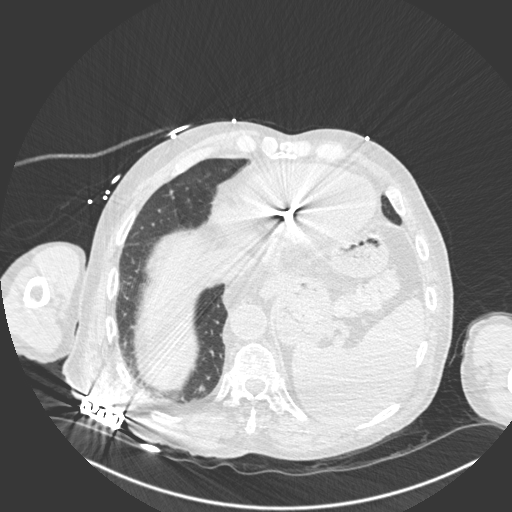
[im 42/142  lung]
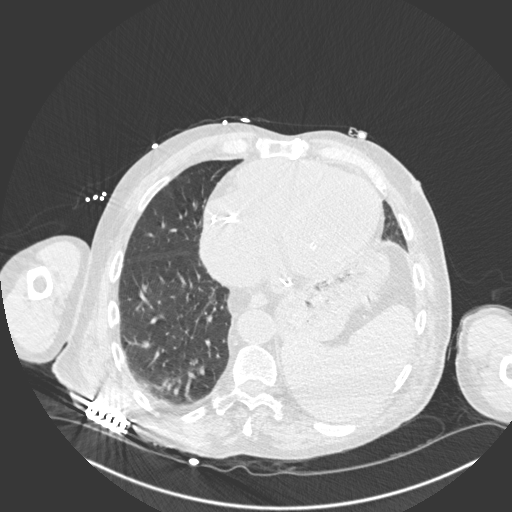
[im 53/142  mediastinal]
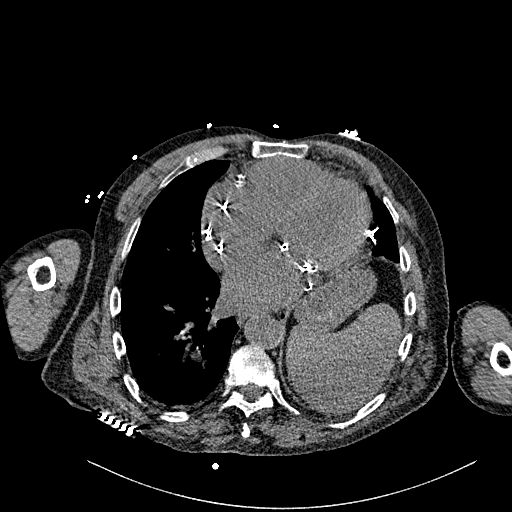
[im 53/142  lung]
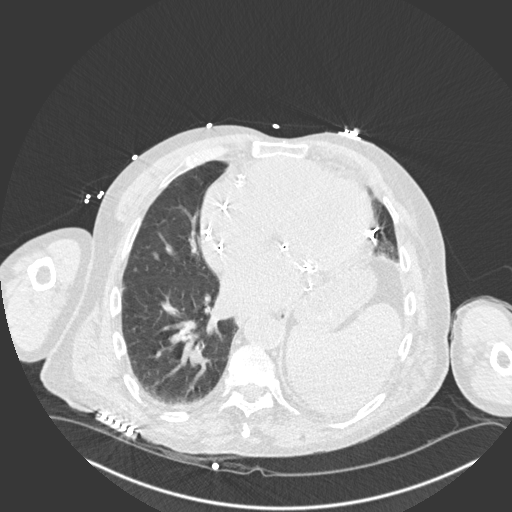
[im 63/142  lung]
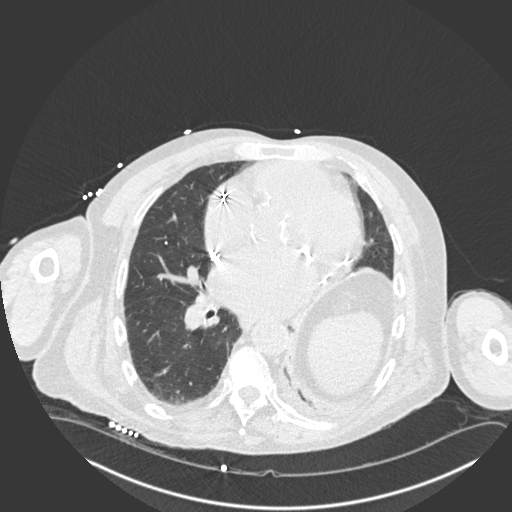
[im 79/142  lung]
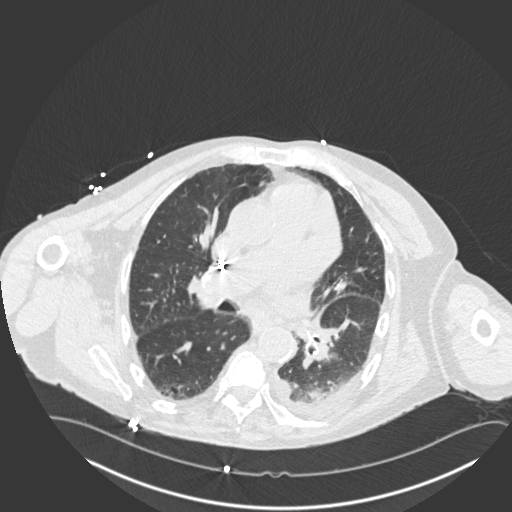
[im 89/142  lung]
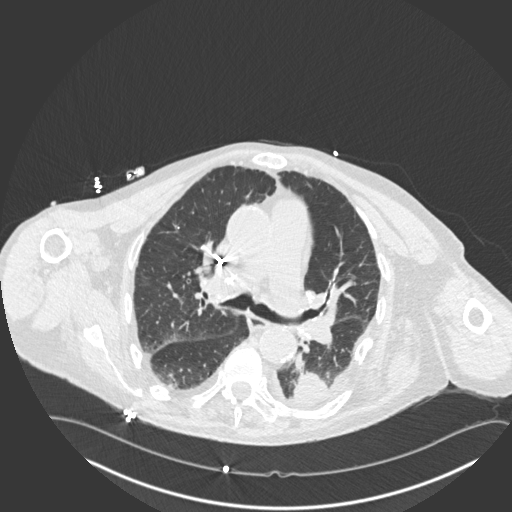
[im 100/142  mediastinal]
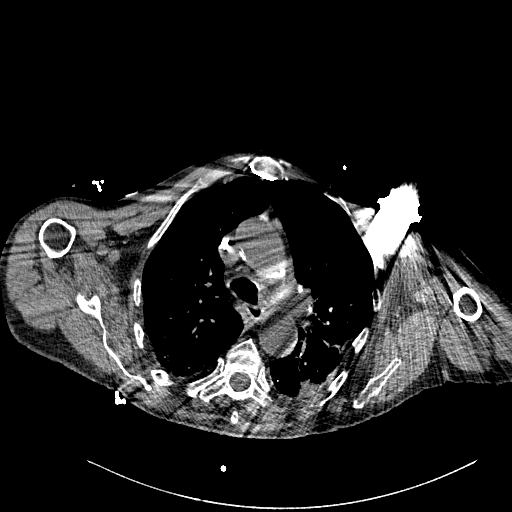
[im 100/142  lung]
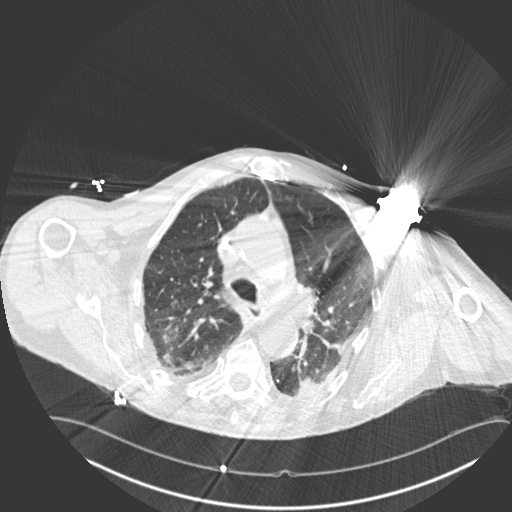
[im 110/142  lung]
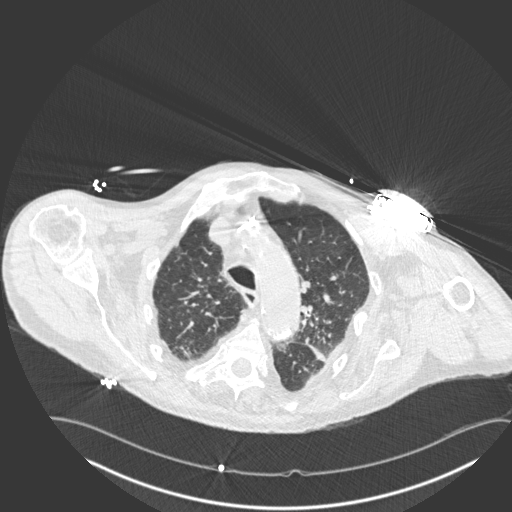
[im 121/142  lung]
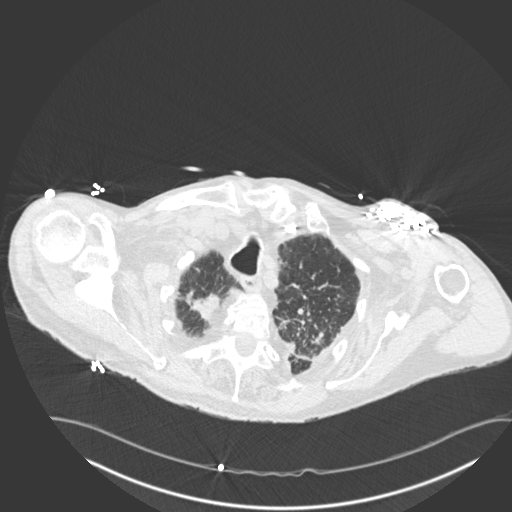
[im 131/142  lung]
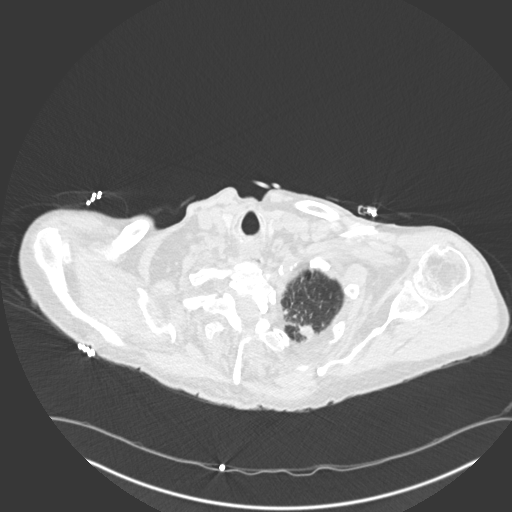

[Series 6: coronal · coronal · 0.60mm/px · 3 of 117 slices shown]
[im 24/117  lung]
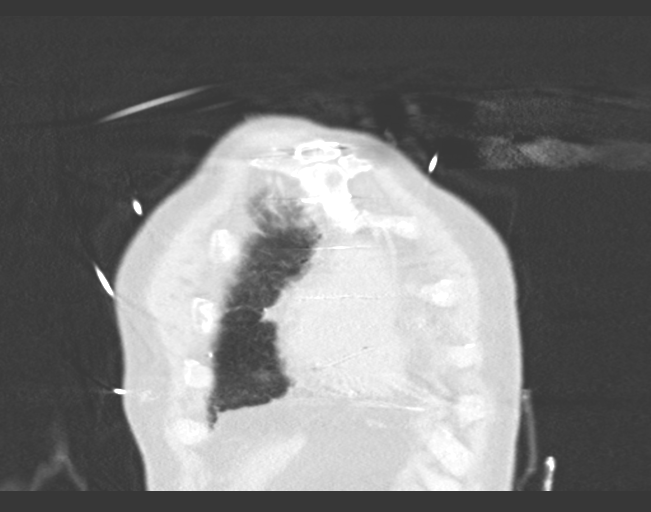
[im 47/117  lung]
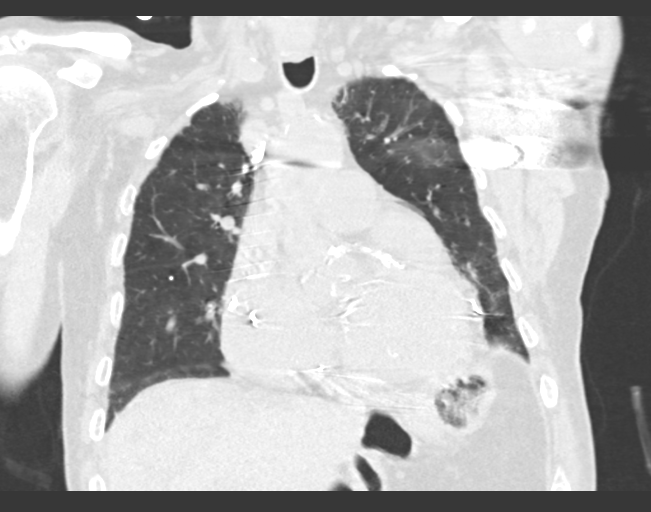
[im 70/117  lung]
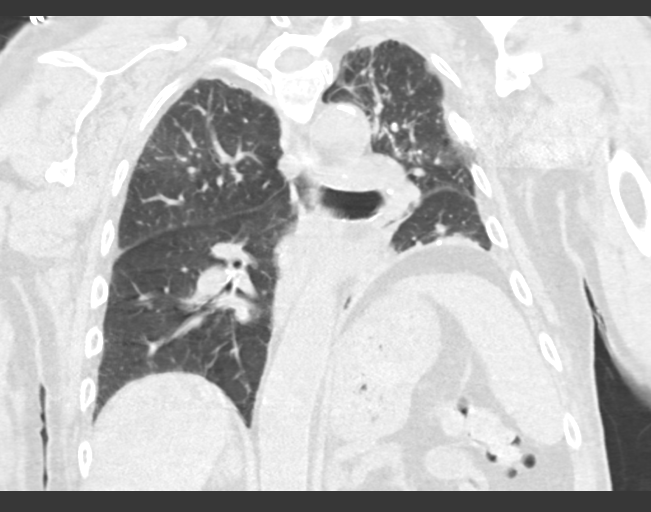

[15 of 36 positions shown; findings below may reference images not displayed]

FINDINGS: Cardiovascular: Cardiomegaly with left atrial enlargement. No
pericardial effusion. Hyperdense blood pool relative to myocardium,
suggesting anemia. Left subclavian ICD.

No evidence of thoracic aortic aneurysm. Atherosclerotic
calcifications of the aortic arch.

Three vessel coronary atherosclerosis.

Mediastinum/Nodes: Small mediastinal lymph nodes, including an 11 mm
short axis low right paratracheal node (series 4/image 47) and a 13
mm short axis subcarinal node (series 4/image 64).

Hilar regions are suboptimally evaluated in the absence of
intravenous contrast.

Visualized thyroid is unremarkable.

Lungs/Pleura: 2.8 x 3.4 x 4.4 cm posterior left lower lobe mass
(series 4/image 86), compatible with primary bronchogenic neoplasm.

Associated small loculated left pleural effusion, possibly
malignant.

Mild patchy/ground-glass opacity in the posterior right upper lobe,
measuring 3.0 x 1.8 cm (series 8/image 47), nonspecific. Mild
infection could have this appearance.

Biapical pleural-parenchymal scarring.

No pneumothorax.

Eventration of the left hemidiaphragm.

Upper Abdomen: Visualized upper abdomen is better evaluated on
recent CTA, with suspected multifocal hepatic metastases.

Musculoskeletal: Exaggerated midthoracic kyphosis. Median
sternotomy.
IMPRESSION: 4.4 cm posterior left lower lobe mass, compatible primary
bronchogenic neoplasm.

Small mediastinal nodes, suspicious for nodal metastases. Hilar
regions are suboptimally evaluated in the absence of intravenous
contrast.

Associated small left pleural effusion, possibly malignant.

Suspected multifocal hepatic metastases are better visualized on
recent CTA.

Aortic Atherosclerosis ([id number]).

## 2023-09-01 NOTE — Telephone Encounter (Signed)
 error
